# Patient Record
Sex: Male | Born: 1960 | Race: White | Hispanic: No | Marital: Married | Smoking: Former smoker
Health system: Southern US, Community
[De-identification: ages and names within clinical notes are randomized; demographics above are authoritative.]

## PROBLEM LIST (undated history)

## (undated) DIAGNOSIS — E7800 Pure hypercholesterolemia, unspecified: Secondary | ICD-10-CM

## (undated) DIAGNOSIS — I10 Essential (primary) hypertension: Secondary | ICD-10-CM

## (undated) HISTORY — DX: Pure hypercholesterolemia, unspecified: E78.00

## (undated) HISTORY — DX: Essential (primary) hypertension: I10

## (undated) HISTORY — PX: WISDOM TOOTH EXTRACTION: SHX21

## (undated) HISTORY — PX: INGUINAL HERNIA REPAIR: SUR1180

---

## 2009-03-22 ENCOUNTER — Ambulatory Visit: Payer: Self-pay | Admitting: Family Medicine

## 2009-03-22 DIAGNOSIS — I10 Essential (primary) hypertension: Secondary | ICD-10-CM | POA: Insufficient documentation

## 2009-03-22 DIAGNOSIS — J209 Acute bronchitis, unspecified: Secondary | ICD-10-CM

## 2009-03-22 DIAGNOSIS — E785 Hyperlipidemia, unspecified: Secondary | ICD-10-CM | POA: Insufficient documentation

## 2010-04-27 ENCOUNTER — Ambulatory Visit: Payer: Self-pay | Admitting: Emergency Medicine

## 2010-12-10 NOTE — Assessment & Plan Note (Signed)
Summary: COLD?/TM   Vital Signs:  Patient Profile:   49 Years Old Male CC:      productive cough X 1 week, reoccuring  Height:     69 inches Weight:      271 pounds O2 Sat:      97 % O2 treatment:    Room Air Temp:     98.6 degrees F oral Pulse rate:   126 / minute Resp:     14 per minute BP sitting:   136 / 91  (right arm) Cuff size:   large  Pt. in pain?   no  Vitals Entered By: Lajean Saver RN (April 27, 2010 3:38 PM)                   Updated Prior Medication List: LISINOPRIL 20 MG TABS (LISINOPRIL) 1 qd WELLBUTRIN SR 200 MG XR12H-TAB (BUPROPION HCL) 1 bid VYTORIN 10-20 MG TABS (EZETIMIBE-SIMVASTATIN) 1 qd SIMVASTATIN 40 MG TABS (SIMVASTATIN) once daily  Current Allergies (reviewed today): No known allergies History of Present Illness Chief Complaint: productive cough X 1 week, reoccuring  History of Present Illness: Cough for 1 week.  Wife was here earlier in the week with same symptoms.  He has productive cough, green phlegm, nasal congestion.  No fever, chills, N/V. Cheratussin helps.  Cefdinir by doctor about 1 month ago didn't take all his symptoms away.  REVIEW OF SYSTEMS Constitutional Symptoms      Denies fever, chills, night sweats, weight loss, weight gain, and fatigue.  Eyes       Denies change in vision, eye pain, eye discharge, glasses, contact lenses, and eye surgery. Ear/Nose/Throat/Mouth       Complains of sinus problems and hoarseness.      Denies hearing loss/aids, change in hearing, ear pain, ear discharge, dizziness, frequent runny nose, frequent nose bleeds, sore throat, and tooth pain or bleeding.  Respiratory       Complains of productive cough.      Denies dry cough, wheezing, shortness of breath, asthma, bronchitis, and emphysema/COPD.  Cardiovascular       Denies murmurs, chest pain, and tires easily with exhertion.    Gastrointestinal       Denies stomach pain, nausea/vomiting, diarrhea, constipation, blood in bowel movements, and  indigestion. Genitourniary       Denies painful urination, kidney stones, and loss of urinary control. Neurological       Denies paralysis, seizures, and fainting/blackouts. Musculoskeletal       Denies muscle pain, joint pain, joint stiffness, decreased range of motion, redness, swelling, muscle weakness, and gout.  Skin       Denies bruising, unusual mles/lumps or sores, and hair/skin or nail changes.  Psych       Denies mood changes, temper/anger issues, anxiety/stress, speech problems, depression, and sleep problems. Other Comments: patient was treated for same symptoms 3 weeks ago with antibiotic   Past History:  Past Medical History: Reviewed history from 03/22/2009 and no changes required. Hyperlipidemia Hypertension  Past Surgical History: Reviewed history from 03/22/2009 and no changes required. Inguinal herniorrhaphy  Family History: Reviewed history from 03/22/2009 and no changes required. Mother, Healthy Father, D, Bladder CA Brother, Healthy Sister, Healthy  Social History: Reviewed history from 03/22/2009 and no changes required. X-smoker, quit 25 yrs ago ETOH-yes No drugs [employer] Physical Exam General appearance: well developed, well nourished, no acute distress.  Actively coughing during exam Ears: normal, no lesions or deformities Nasal: clear discharge Oral/Pharynx:  No exudate, uvula midline without deviation.  +Postnasal drip. Neck: neck supple,  trachea midline, no masses Chest/Lungs: no rales, wheezes, or rhonchi bilateral, breath sounds equal without effort Heart: regular rate and  rhythm, no murmur Skin: no obvious rashes or lesions  Plan New Medications/Changes: CHERATUSSIN AC 100-10 MG/5ML SYRP (GUAIFENESIN-CODEINE) 5-10cc by mouth Q6 hours as needed for cough  #180cc x 0, 04/27/2010, Hoyt Koch MD ZITHROMAX Z-PAK 250 MG TABS (AZITHROMYCIN) Use as directed  #1 x 0, 04/27/2010, Hoyt Koch MD  New Orders: New Patient  Level III [phone]  The patient and/or caregiver has been counseled thoroughly with regard to medications prescribed including dosage, schedule, interactions, rationale for use, and possible side effects and they verbalize understanding.  Diagnoses and expected course of recovery discussed and will return if not improved as expected or if the condition worsens. Patient and/or caregiver verbalized understanding.  Prescriptions: CHERATUSSIN AC 100-10 MG/5ML SYRP (GUAIFENESIN-CODEINE) 5-10cc by mouth Q6 hours as needed for cough  #180cc x 0   Entered and Authorized by:   Hoyt Koch MD   Signed by:   Hoyt Koch MD on 04/27/2010   Method used:   Printed then faxed to ...       [facility]  [address] [phone]* (retail)       [address] [place], [place]  [address]       Ph: [phone] or [phone]       Fax: [phone]   RxID:   [id number] ZITHROMAX Z-PAK 250 MG TABS (AZITHROMYCIN) Use as directed  #1 x 0   Entered and Authorized by:   Hoyt Koch MD   Signed by:   Hoyt Koch MD on 04/27/2010   Method used:   Printed then faxed to ...       [facility]  [address] [phone]* (retail)       [address] 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

## 2014-05-17 ENCOUNTER — Encounter: Payer: Self-pay | Admitting: Internal Medicine

## 2014-06-29 ENCOUNTER — Ambulatory Visit (AMBULATORY_SURGERY_CENTER): Payer: BC Managed Care – PPO

## 2014-06-29 VITALS — Ht 73.0 in | Wt 297.0 lb

## 2014-06-29 DIAGNOSIS — Z1211 Encounter for screening for malignant neoplasm of colon: Secondary | ICD-10-CM

## 2014-06-29 MED ORDER — MOVIPREP 100 G PO SOLR: 1.0000 | Freq: Once | ORAL | Status: DC

## 2014-06-29 NOTE — Progress Notes (Signed)
No allergies to eggs or soy No diet/weight loss meds No home oxygen No past problems with anesthesia  Has email  Emmi instructions given for colonoscopy 

## 2014-07-13 ENCOUNTER — Encounter: Payer: Self-pay | Admitting: Internal Medicine

## 2014-07-13 ENCOUNTER — Ambulatory Visit (AMBULATORY_SURGERY_CENTER): Payer: BC Managed Care – PPO | Admitting: Internal Medicine

## 2014-07-13 VITALS — BP 109/60 | HR 69 | Temp 98.6°F | Resp 15 | Ht 73.0 in | Wt 297.0 lb

## 2014-07-13 DIAGNOSIS — D126 Benign neoplasm of colon, unspecified: Secondary | ICD-10-CM

## 2014-07-13 DIAGNOSIS — Z1211 Encounter for screening for malignant neoplasm of colon: Secondary | ICD-10-CM

## 2014-07-13 MED ORDER — SODIUM CHLORIDE 0.9 % IV SOLN: 500.0000 mL | INTRAVENOUS | Status: DC

## 2014-07-13 NOTE — Progress Notes (Signed)
A/ox3 pleased with MAC, report to Jane RN 

## 2014-07-13 NOTE — Progress Notes (Signed)
Called to room to assist during endoscopic procedure.  Patient ID and intended procedure confirmed with present staff. Received instructions for my participation in the procedure from the performing physician.  

## 2014-07-13 NOTE — Op Note (Signed)
[facility]  [address] [place], [address]   COLONOSCOPY PROCEDURE REPORT  PATIENT: Ruben Reeves, Ruben Reeves  MR#: [id number] BIRTHDATE: [birth year] , 52  yrs. old GENDER: Male ENDOSCOPIST: Jerene Bears, MD REFERRED EU:[id number] Reynaldo Minium, M.D. PROCEDURE DATE:  07/13/2014 PROCEDURE:   Colonoscopy with snare polypectomy First Screening Colonoscopy - Avg.  risk and is 50 yrs.  old or older Yes.  Prior Negative Screening - Now for repeat screening. N/A  History of Adenoma - Now for follow-up colonoscopy & has been > or = to 3 yrs.  N/A  Polyps Removed Today? Yes. ASA CLASS:   Class II INDICATIONS:average risk screening and first colonoscopy. MEDICATIONS: MAC sedation, administered by CRNA and propofol (Diprivan) 400mg  IV  DESCRIPTION OF PROCEDURE:   After the risks benefits and alternatives of the procedure were thoroughly explained, informed consent was obtained.  A digital rectal exam revealed no rectal mass.   The LB [id number]  endoscope was introduced through the anus and advanced to the cecum, which was identified by both the appendix and ileocecal valve. No adverse events experienced. The quality of the prep was good, using MoviPrep  The instrument was then slowly withdrawn as the colon was fully examined.  COLON FINDINGS: Two sessile polyps measuring 6 and 3 mm in size were found in the ascending colon (SSA) and transverse colon. Polypectomy was performed using cold snare.  All resections were complete and all polyp tissue was completely retrieved.   There was moderate diverticulosis noted in the ascending colon, transverse colon, descending colon, and sigmoid colon with associated muscular hypertrophy.  Retroflexed views revealed no abnormalities. The time to cecum=2 minutes 18 seconds.  Withdrawal time=15 minutes 55 seconds.  The scope was withdrawn and the procedure completed. COMPLICATIONS: There were no complications.  ENDOSCOPIC IMPRESSION: 1.   Two  sessile polyps measuring 6 and 3 mm in size were found in the ascending colon and transverse colon; Polypectomy was performed using cold snare 2.   There was moderate diverticulosis noted in the ascending colon, transverse colon, descending colon, and sigmoid colon  RECOMMENDATIONS: 1.  Avoid all NSAIDS for the next 2 weeks. 2.  High fiber diet 3.  If the polyps removed today are proven to be adenomatous (pre-cancerous) polyps, you will need a repeat colonoscopy in 5 years.  Otherwise you should continue to follow colorectal cancer screening guidelines for "routine risk" patients with colonoscopy in 10 years.  You will receive a letter within 1-2 weeks with the results of your biopsy as well as final recommendations.  Please call my office if you have not received a letter after 3 weeks.   eSigned:  Jerene Bears, MD 07/13/2014 10:07 AM      cc: The Patient and Burnard Bunting, MD

## 2014-07-13 NOTE — Patient Instructions (Signed)
YOU HAD AN ENDOSCOPIC PROCEDURE TODAY AT THE [facility]: Refer to the procedure report that was given to you for any specific questions about what was found during the examination.  If the procedure report does not answer your questions, please call your gastroenterologist to clarify.  If you requested that your care partner not be given the details of your procedure findings, then the procedure report has been included in a sealed envelope for you to review at your convenience later.  YOU SHOULD EXPECT: Some feelings of bloating in the abdomen. Passage of more gas than usual.  Walking can help get rid of the air that was put into your GI tract during the procedure and reduce the bloating. If you had a lower endoscopy (such as a colonoscopy or flexible sigmoidoscopy) you may notice spotting of blood in your stool or on the toilet paper. If you underwent a bowel prep for your procedure, then you may not have a normal bowel movement for a few days.  DIET: Your first meal following the procedure should be a light meal and then it is ok to progress to your normal diet.  A half-sandwich or bowl of soup is an example of a good first meal.  Heavy or fried foods are harder to digest and may make you feel nauseous or bloated.  Likewise meals heavy in dairy and vegetables can cause extra gas to form and this can also increase the bloating.  Drink plenty of fluids but you should avoid alcoholic beverages for 24 hours.  ACTIVITY: Your care partner should take you home directly after the procedure.  You should plan to take it easy, moving slowly for the rest of the day.  You can resume normal activity the day after the procedure however you should NOT DRIVE or use heavy machinery for 24 hours (because of the sedation medicines used during the test).    SYMPTOMS TO REPORT IMMEDIATELY: A gastroenterologist can be reached at any hour.  During normal business hours, 8:30 AM to 5:00 PM Monday through Friday,  call [phone].  After hours and on weekends, please call the GI answering service at [phone] who will take a message and have the physician on call contact you.   Following lower endoscopy (colonoscopy or flexible sigmoidoscopy):  Excessive amounts of blood in the stool  Significant tenderness or worsening of abdominal pains  Swelling of the abdomen that is new, acute  Fever of 100F or higher    FOLLOW UP: If any biopsies were taken you will be contacted by phone or by letter within the next 1-3 weeks.  Call your gastroenterologist if you have not heard about the biopsies in 3 weeks.  Our staff will call the home number listed on your records the next business day following your procedure to check on you and address any questions or concerns that you may have at that time regarding the information given to you following your procedure. This is a courtesy call and so if there is no answer at the home number and we have not heard from you through the emergency physician on call, we will assume that you have returned to your regular daily activities without incident.  SIGNATURES/CONFIDENTIALITY: You and/or your care partner have signed paperwork which will be entered into your electronic medical record.  These signatures attest to the fact that that the information above on your After Visit Summary has been reviewed and is understood.  Full responsibility of the confidentiality   this discharge information lies with you and/or your care-partner.  Polyp, diverticulosis and high fiber diet information given. 

## 2014-07-14 ENCOUNTER — Telehealth: Payer: Self-pay | Admitting: *Deleted

## 2014-07-14 NOTE — Telephone Encounter (Signed)
  Follow up Call-  Call back number 07/13/2014  Post procedure Call Back phone  # [phone]  Permission to leave phone message Yes     Patient questions:  Do you have a fever, pain , or abdominal swelling? No. Pain Score  0 *  Have you tolerated food without any problems? Yes.    Have you been able to return to your normal activities? Yes.    Do you have any questions about your discharge instructions: Diet   No. Medications  No. Follow up visit  No.  Do you have questions or concerns about your Care? Yes.    Actions: * If pain score is 4 or above: No action needed, pain <4.

## 2014-07-21 ENCOUNTER — Encounter: Payer: Self-pay | Admitting: Internal Medicine

## 2014-11-10 ENCOUNTER — Emergency Department (HOSPITAL_BASED_OUTPATIENT_CLINIC_OR_DEPARTMENT_OTHER)
Admission: EM | Admit: 2014-11-10 | Discharge: 2014-11-10 | Disposition: A | Discharge status: Home / Self Care | Payer: 59 | Attending: Emergency Medicine | Admitting: Emergency Medicine

## 2014-11-10 ENCOUNTER — Encounter (HOSPITAL_BASED_OUTPATIENT_CLINIC_OR_DEPARTMENT_OTHER): Payer: Self-pay

## 2014-11-10 ENCOUNTER — Emergency Department (HOSPITAL_BASED_OUTPATIENT_CLINIC_OR_DEPARTMENT_OTHER): Payer: 59

## 2014-11-10 DIAGNOSIS — E780 Pure hypercholesterolemia: Secondary | ICD-10-CM | POA: Insufficient documentation

## 2014-11-10 DIAGNOSIS — Z7982 Long term (current) use of aspirin: Secondary | ICD-10-CM | POA: Diagnosis not present

## 2014-11-10 DIAGNOSIS — I10 Essential (primary) hypertension: Secondary | ICD-10-CM | POA: Diagnosis not present

## 2014-11-10 DIAGNOSIS — R600 Localized edema: Secondary | ICD-10-CM | POA: Insufficient documentation

## 2014-11-10 DIAGNOSIS — Z87891 Personal history of nicotine dependence: Secondary | ICD-10-CM | POA: Diagnosis not present

## 2014-11-10 DIAGNOSIS — N441 Cyst of tunica albuginea testis: Secondary | ICD-10-CM | POA: Diagnosis not present

## 2014-11-10 DIAGNOSIS — Z79899 Other long term (current) drug therapy: Secondary | ICD-10-CM | POA: Diagnosis not present

## 2014-11-10 DIAGNOSIS — N5089 Other specified disorders of the male genital organs: Secondary | ICD-10-CM

## 2014-11-10 DIAGNOSIS — N508 Other specified disorders of male genital organs: Secondary | ICD-10-CM | POA: Diagnosis present

## 2014-11-10 LAB — CBC WITH DIFFERENTIAL/PLATELET
BASOS PCT: 1 % (ref 0–1)
Basophils Absolute: 0.1 10*3/uL (ref 0.0–0.1)
EOS ABS: 0.3 10*3/uL (ref 0.0–0.7)
Eosinophils Relative: 3 % (ref 0–5)
HCT: 39.1 % (ref 39.0–52.0)
HEMOGLOBIN: 13.5 g/dL (ref 13.0–17.0)
LYMPHS ABS: 1.8 10*3/uL (ref 0.7–4.0)
Lymphocytes Relative: 20 % (ref 12–46)
MCH: 32.2 pg (ref 26.0–34.0)
MCHC: 34.5 g/dL (ref 30.0–36.0)
MCV: 93.3 fL (ref 78.0–100.0)
MONO ABS: 0.9 10*3/uL (ref 0.1–1.0)
Monocytes Relative: 10 % (ref 3–12)
NEUTROS ABS: 6 10*3/uL (ref 1.7–7.7)
Neutrophils Relative %: 66 % (ref 43–77)
PLATELETS: 290 10*3/uL (ref 150–400)
RBC: 4.19 MIL/uL — AB (ref 4.22–5.81)
RDW: 12 % (ref 11.5–15.5)
WBC: 8.9 10*3/uL (ref 4.0–10.5)

## 2014-11-10 LAB — BASIC METABOLIC PANEL
Anion gap: 8 (ref 5–15)
BUN: 13 mg/dL (ref 6–23)
CALCIUM: 9 mg/dL (ref 8.4–10.5)
CO2: 28 mmol/L (ref 19–32)
Chloride: 100 mEq/L (ref 96–112)
Creatinine, Ser: 0.74 mg/dL (ref 0.50–1.35)
GFR calc non Af Amer: >90 mL/min (ref >90)
Glucose, Bld: 131 mg/dL — ABNORMAL HIGH (ref 70–99)
POTASSIUM: 3.6 mmol/L (ref 3.5–5.1)
Sodium: 136 mmol/L (ref 135–145)

## 2014-11-10 LAB — URINALYSIS, ROUTINE W REFLEX MICROSCOPIC
BILIRUBIN URINE: NEGATIVE
Glucose, UA: NEGATIVE mg/dL
Hgb urine dipstick: NEGATIVE
KETONES UR: NEGATIVE mg/dL
Leukocytes, UA: NEGATIVE
Nitrite: NEGATIVE
Protein, ur: NEGATIVE mg/dL
Specific Gravity, Urine: 1.019 (ref 1.005–1.030)
Urobilinogen, UA: 0.2 mg/dL (ref 0.0–1.0)
pH: 6.5 (ref 5.0–8.0)

## 2014-11-10 NOTE — ED Notes (Signed)
Pt C/o left testicle pain and swelling since weds.  Pt denies any known injury to the area.  Pt h/o enlarged prostates and bladder infections.  No recent changes to medications.  Pt states pain comes and goes and describes as "throbbing".  Seen at urgent care this am and advised to come to ed for u/s testing.  Pt denies any fever, nausea or other symptoms.

## 2014-11-10 NOTE — Discharge Instructions (Signed)
As discussed you need to follow up with your urologist. Scrotal Masses Scrotal swelling is common in men of all ages. Common types of testicular masses include:   Hydrocele. The most common benign testicular mass in an adult. Hydroceles are generally soft and painless collections of fluid in the scrotal sac. These can rapidly change size as the fluid enters or leaves. Hydroceles can be associated with an underlying cancer of the testicle.  Spermatoceles. Generally soft and painless cyst-like masses in the scrotum that contain fluid, usually above the testicle. They can rapidly change size as the fluid enters or leaves. They are more prominent while standing or exercising. Sometimes, spermatoceles may cause a sensation of heaviness or a dull ache.  Orchitis. Inflammation of the testicle. It is painful and may be associated with a fever or symptoms of a urinary tract infection, including frequent and painful urination. It is common in males who have the mumps.  Varicocele. An enlargement of the veins that drain the testicles. Varicoceles usually occur on the left side of the scrotum. This condition can increase the risk of infertility. Varicocele is sometimes more prominent while standing or exercising. Sometimes, varicoceles may cause a sensation of heaviness or a dull ache.  Inguinal hernia. A bulge caused by a portion of intestine protruding into the scrotum through a weak area in the abdominal muscles. Hernias may or may not be painful. They are soft and usually enlarge with coughing or straining.  Torsion of the testis. This can cause a testicular mass that develops quickly and is associated with tenderness or fever, or both. It is caused by a twisting of the testicle within the sac. It also reduces the blood supply and can destroy the testis if not treated quickly with surgery.  Epididymitis. Inflammation of the epididymis (a structure attached above and behind the testicle), usually caused by a  urinary tract infection or a sexually transmitted infection. This generally shows up as testicular discomfort and swelling and may include pain during urination. It is frequently associated with a testicle infection.  Testicular appendages. Remnants of tissue on the testis present since birth. A testicular appendage can twist on its blood supply and cause pain. In most cases, this is seen as a blue dot on the scrotum.  Hematocele. A collection of blood between the layers of the sac inside the scrotum. It usually is caused by trauma to the scrotum.  Sebaceous cysts. These can be a swelling in the skin of the scrotum and are usually painless.  Cancer (carcinoma) of the skin of the scrotum. It can cause scrotal swelling, but this is rare. Document Released: 05/03/2003 Document Revised: 06/29/2013 Document Reviewed: 04/18/2013 [facility] Patient Information 2015 [place], [place]. This information is not intended to replace advice given to you by your health care provider. Make sure you discuss any questions you have with your health care provider.

## 2014-11-10 NOTE — ED Provider Notes (Signed)
CSN: [id number]     Arrival date & time 11/10/14  1257 History   First MD Initiated Contact with Patient 11/10/14 1333     Chief Complaint  Patient presents with  . Testicle Pain     (Consider location/radiation/quality/duration/timing/severity/associated sxs/prior Treatment) HPI Comments: Pt c/o testicular pain that started 3 days ago. Denies abdominal pain, redness, warmth, dysuria or discharge. Pt denies history of similar symptoms. Pt states that no painful unless he moves in the wrong direction. Has had uti and prostatitis previous but nothing recently. No fever or n/v.  The history is provided by the patient. No language interpreter was used.    Past Medical History  Diagnosis Date  . Hypertension   . Hypercholesteremia    Past Surgical History  Procedure Laterality Date  . Inguinal hernia repair    . Wisdom tooth extraction     Family History  Problem Relation Age of Onset  . Colon cancer Neg Hx   . Pancreatic cancer Neg Hx   . Rectal cancer Neg Hx   . Stomach cancer Neg Hx   . Colon polyps Mother   . Bladder Cancer Father   . Kidney disease Father   . Colon polyps Father   . Heart disease Maternal Grandmother   . Stroke Maternal Grandfather   . Heart disease Paternal Grandmother   . Stroke Paternal Grandfather    History  Substance Use Topics  . Smoking status: Former Smoker    Quit date: 06/29/1985  . Smokeless tobacco: Never Used  . Alcohol Use: 0.6 oz/week    1 Glasses of wine per week    Review of Systems  All other systems reviewed and are negative.     Allergies  Sulfa antibiotics  Home Medications   Prior to Admission medications   Medication Sig Start Date End Date Taking? Authorizing Provider  aspirin 81 MG tablet Take 81 mg by mouth daily.    Historical Provider, MD  cholecalciferol (VITAMIN D) 1000 UNITS tablet Take 1,000 Units by mouth 2 (two) times daily.    Historical Provider, MD  Coenzyme Q10 (CO Q 10 PO) Take 200 mg by mouth  daily.    Historical Provider, MD  hydrochlorothiazide (HYDRODIURIL) 25 MG tablet Take 25 mg by mouth daily.    Historical Provider, MD  lisinopril (PRINIVIL,ZESTRIL) 20 MG tablet Take 20 mg by mouth daily.    Historical Provider, MD  Magnesium Oxide (MAG-CAPS PO) Take by mouth. mag citrate 400mg  po daily    Historical Provider, MD  Multiple Vitamin (MULTIVITAMIN) tablet Take 1 tablet by mouth daily.    Historical Provider, MD  Omega-3 Fatty Acids (FISH OIL) 1200 MG CPDR Take 1,200 mg by mouth 2 (two) times daily.    Historical Provider, MD  simvastatin (ZOCOR) 20 MG tablet Take 20 mg by mouth daily.    Historical Provider, MD  TURMERIC PO Take 1,160 mg by mouth daily.    Historical Provider, MD  vitamin C (ASCORBIC ACID) 500 MG tablet Take 500 mg by mouth daily.    Historical Provider, MD   BP 147/86 mmHg  Pulse 71  Temp(Src) 98.2 F (36.8 C) (Oral)  Resp 16  Ht 6' (1.829 m)  Wt 302 lb (136.986 kg)  BMI 40.95 kg/m2  SpO2 100% Physical Exam  Constitutional: He appears well-developed and well-nourished.  Cardiovascular: Normal rate and regular rhythm.   Abdominal: Soft. Bowel sounds are normal. There is no tenderness.  Genitourinary:  Left testicular swelling noted. No definite  hernia noted. No drainage noted  Musculoskeletal: He exhibits edema.  Neurological: He is alert.  Skin: Skin is warm and dry.  Nursing note and vitals reviewed.   ED Course  Procedures (including critical care time) Labs Review Labs Reviewed  CBC WITH DIFFERENTIAL - Abnormal; Notable for the following:    RBC 4.19 (*)    All other components within normal limits  BASIC METABOLIC PANEL - Abnormal; Notable for the following:    Glucose, Bld 131 (*)    All other components within normal limits  URINALYSIS, ROUTINE W REFLEX MICROSCOPIC    Imaging Review US Scrotum  11/10/2014   CLINICAL DATA:  Initial evaluation for left testicular swelling and heaviness and tenderness to palpation constantly for 2  days, personal history of right inguinal hernia repair 45 years ago and vasectomy 25 years ago  EXAM: [place]: Complete ultrasound examination of the testicles, epididymis, and other scrotal structures was performed. Color and spectral Doppler ultrasound were also utilized to evaluate blood flow to the testicles.  COMPARISON:  None.  FINDINGS: Right testicle  Measurements: 38 x 24 x 27 mm. No mass or microlithiasis visualized.  Left testicle  Measurements: 38 x 20 x 30 mm. No mass or microlithiasis visualized.  Right epididymis:  6 mm right epididymal cyst  Left epididymis: 4 mm epididymal cyst. There is also a prominent volume of echogenic tissue around the left epididymis, which may represent adipose tissue; it is asymmetric compared to the right side but not clearly of acute significance.It does not change with Valsalva, as would be expected of a fat-containing inguinal hernia.  Hydrocele:  Small bilateral hydroceles  Varicocele:  None visualized.  Pulsed Doppler interrogation of both testes demonstrates low resistance arterial and venous waveforms bilaterally.  Other findings: There is a complex cystic structure posterior and lateral to the left testicle, separate from the testicle and also not clearly related to the left epididymis. There is complex fluid within the structure with some mild debris layering dependently. It measures 19 x 19 x 14mm.  IMPRESSION: 1.  No evidence of torsion, orchitis, or epididymitis.  2. 11mm cystic structure in the left hemiscrotum, possibly a tunica albuginea cyst or other similar finding not of acute origin.  3. Asymmetric adipose tissue in left scrotum adjacent to epididymis, which does not respond to Valsalva as would be expected of an inguinal hernia. That remains a possibility that would be better evaluated with CT scan.   Electronically Signed   By: Skipper Cliche M.D.   On: 11/10/2014 14:43   [place] Art/ven Flow Abd  Pelv Doppler  11/10/2014   CLINICAL DATA:  Initial evaluation for left testicular swelling and heaviness and tenderness to palpation constantly for 2 days, personal history of right inguinal hernia repair 45 years ago and vasectomy 25 years ago  EXAM: [place]: Complete ultrasound examination of the testicles, epididymis, and other scrotal structures was performed. Color and spectral Doppler ultrasound were also utilized to evaluate blood flow to the testicles.  COMPARISON:  None.  FINDINGS: Right testicle  Measurements: 38 x 24 x 27 mm. No mass or microlithiasis visualized.  Left testicle  Measurements: 38 x 20 x 30 mm. No mass or microlithiasis visualized.  Right epididymis:  6 mm right epididymal cyst  Left epididymis: 4 mm epididymal cyst. There is also a prominent volume of echogenic tissue around the left epididymis, which may  represent adipose tissue; it is asymmetric compared to the right side but not clearly of acute significance.It does not change with Valsalva, as would be expected of a fat-containing inguinal hernia.  Hydrocele:  Small bilateral hydroceles  Varicocele:  None visualized.  Pulsed Doppler interrogation of both testes demonstrates low resistance arterial and venous waveforms bilaterally.  Other findings: There is a complex cystic structure posterior and lateral to the left testicle, separate from the testicle and also not clearly related to the left epididymis. There is complex fluid within the structure with some mild debris layering dependently. It measures 19 x 19 x 42mm.  IMPRESSION: 1.  No evidence of torsion, orchitis, or epididymitis.  2. 48mm cystic structure in the left hemiscrotum, possibly a tunica albuginea cyst or other similar finding not of acute origin.  3. Asymmetric adipose tissue in left scrotum adjacent to epididymis, which does not respond to Valsalva as would be expected of an inguinal hernia. That remains a  possibility that would be better evaluated with CT scan.   Electronically Signed   By: Skipper Cliche M.D.   On: 11/10/2014 14:43     EKG Interpretation None      MDM   Final diagnoses:  Testicular swelling  Cyst of tunica albuginea testis    Pt not having any pain. Will have follow up with his urologist DR. Evan. Nothing to be acutely. Pt doesn't have torsion. Exam not consistent with hernia    Glendell Docker, NP 11/10/14 [place], MD 11/10/14 [phone]

## 2014-11-10 NOTE — ED Notes (Signed)
Left testicle pain since Wednesday. No dysuria.

## 2015-01-03 ENCOUNTER — Other Ambulatory Visit: Payer: Self-pay | Admitting: Urology

## 2015-01-03 DIAGNOSIS — R1909 Other intra-abdominal and pelvic swelling, mass and lump: Secondary | ICD-10-CM

## 2015-01-10 ENCOUNTER — Ambulatory Visit
Admission: RE | Admit: 2015-01-10 | Discharge: 2015-01-10 | Disposition: A | Discharge status: Home / Self Care | Payer: 59 | Source: Ambulatory Visit | Attending: Urology | Admitting: Urology

## 2015-01-10 DIAGNOSIS — R1909 Other intra-abdominal and pelvic swelling, mass and lump: Secondary | ICD-10-CM

## 2015-01-10 MED ORDER — IOHEXOL 300 MG/ML  SOLN
125.0000 mL | Freq: Once | INTRAMUSCULAR | Status: AC | PRN
  Administered 2015-01-10: 125 mL via INTRAVENOUS

## 2015-02-25 ENCOUNTER — Emergency Department (INDEPENDENT_AMBULATORY_CARE_PROVIDER_SITE_OTHER)
Admission: EM | Admit: 2015-02-25 | Discharge: 2015-02-25 | Disposition: A | Discharge status: Home / Self Care | Payer: 59 | Source: Home / Self Care | Attending: Family Medicine | Admitting: Family Medicine

## 2015-02-25 ENCOUNTER — Encounter: Payer: Self-pay | Admitting: *Deleted

## 2015-02-25 DIAGNOSIS — S61209A Unspecified open wound of unspecified finger without damage to nail, initial encounter: Secondary | ICD-10-CM | POA: Diagnosis not present

## 2015-02-25 DIAGNOSIS — Z23 Encounter for immunization: Secondary | ICD-10-CM | POA: Diagnosis not present

## 2015-02-25 MED ORDER — TETANUS-DIPHTHERIA TOXOIDS TD 5-2 LFU IM INJ
0.5000 mL | INJECTION | Freq: Once | INTRAMUSCULAR | Status: AC
  Administered 2015-02-25: 0.5 mL via INTRAMUSCULAR

## 2015-02-25 NOTE — ED Notes (Signed)
Pt was using a mandolin and cut his middle finger on R hand approx 2 hours ago.  Bleed stops when pressure applied but starts when bandage removed.  Pt is experiencing no pain.

## 2015-02-25 NOTE — ED Provider Notes (Signed)
CSN: [id number]     Arrival date & time 02/25/15  1433 History   First MD Initiated Contact with Patient 02/25/15 1549     Chief Complaint  Patient presents with  . Extremity Laceration    R hand middle finger      HPI Comments: Approximately two hours ago patient was using a mandolin slicer when he cut the tip of his right third finger.  He does not remember his last tetanus immunization  Patient is a 54 y.o. male presenting with skin laceration. The history is provided by the patient.  Laceration Location:  Finger Finger laceration location:  R middle finger Length (cm):  0.8 Depth:  Through dermis Quality: avulsion   Bleeding: controlled with pressure   Time since incident:  3 hours Laceration mechanism:  Knife Pain details:    Quality:  Aching   Severity:  No pain Foreign body present:  No foreign bodies Relieved by:  Pressure Worsened by:  Movement Tetanus status:  Out of date   Past Medical History  Diagnosis Date  . Hypertension   . Hypercholesteremia    Past Surgical History  Procedure Laterality Date  . Inguinal hernia repair    . Wisdom tooth extraction     Family History  Problem Relation Age of Onset  . Colon cancer Neg Hx   . Pancreatic cancer Neg Hx   . Rectal cancer Neg Hx   . Stomach cancer Neg Hx   . Colon polyps Mother   . Bladder Cancer Father   . Kidney disease Father   . Colon polyps Father   . Heart disease Maternal Grandmother   . Stroke Maternal Grandfather   . Heart disease Paternal Grandmother   . Stroke Paternal Grandfather    History  Substance Use Topics  . Smoking status: Former Smoker    Quit date: 06/29/1985  . Smokeless tobacco: Never Used  . Alcohol Use: 0.6 oz/week    1 Glasses of wine per week    Review of Systems  All other systems reviewed and are negative.   Allergies  Sulfa antibiotics  Home Medications   Prior to Admission medications   Medication Sig Start Date End Date Taking? Authorizing Provider    aspirin 81 MG tablet Take 81 mg by mouth daily.    Historical Provider, MD  cholecalciferol (VITAMIN D) 1000 UNITS tablet Take 1,000 Units by mouth 2 (two) times daily.    Historical Provider, MD  Coenzyme Q10 (CO Q 10 PO) Take 200 mg by mouth daily.    Historical Provider, MD  hydrochlorothiazide (HYDRODIURIL) 25 MG tablet Take 25 mg by mouth daily.    Historical Provider, MD  lisinopril (PRINIVIL,ZESTRIL) 20 MG tablet Take 20 mg by mouth daily.    Historical Provider, MD  Magnesium Oxide (MAG-CAPS PO) Take by mouth. mag citrate 400mg  po daily    Historical Provider, MD  Multiple Vitamin (MULTIVITAMIN) tablet Take 1 tablet by mouth daily.    Historical Provider, MD  Omega-3 Fatty Acids (FISH OIL) 1200 MG CPDR Take 1,200 mg by mouth 2 (two) times daily.    Historical Provider, MD  simvastatin (ZOCOR) 20 MG tablet Take 20 mg by mouth daily.    Historical Provider, MD  TURMERIC PO Take 1,160 mg by mouth daily.    Historical Provider, MD  vitamin C (ASCORBIC ACID) 500 MG tablet Take 500 mg by mouth daily.    Historical Provider, MD   BP 144/73 mmHg  Pulse 86  Temp(Src)  98.2 F (36.8 C) (Oral)  Ht 6\' 1"  (1.854 m)  Wt 298 lb (135.172 kg)  BMI 39.32 kg/m2  SpO2 100% Physical Exam  Constitutional: He is oriented to person, place, and time. He appears well-developed and well-nourished.  HENT:  Head: Atraumatic.  Eyes: Pupils are equal, round, and reactive to light.  Musculoskeletal:       Right hand: He exhibits laceration. He exhibits normal range of motion, no tenderness, normal capillary refill and no swelling.       Hands: At the very tip of patient's right 3rd finger there is a superficial 23mm diameter simple skin avulsion, as noted on diagram.  Wound is clean without debris.  No involvement of fingernail.    Neurological: He is oriented to person, place, and time.  Skin: Skin is warm and dry.  Nursing note and vitals reviewed.   ED Course  Procedures  Wound care fingertip  avulsion:  Cleaned fingertip with HibiClens and saline.  Applied Mepelex Lite dressing, followed by Kerlix gauze wrap and light compression dressing with CoBan wrap.   MDM   1. Avulsion of skin of finger without complication, initial encounter    Leave Mepelex Lite dressing in place until follow-up visit. Keep wound clean and dry.  Return tomorrow for follow-up.    Kandra Nicolas, MD 03/04/15 [phone]

## 2015-02-25 NOTE — Discharge Instructions (Signed)
Keep wound clean and dry.  Leave dressing in place until follow-up visit tomorrow.

## 2015-02-26 ENCOUNTER — Emergency Department (INDEPENDENT_AMBULATORY_CARE_PROVIDER_SITE_OTHER)
Admission: EM | Admit: 2015-02-26 | Discharge: 2015-02-26 | Disposition: A | Discharge status: Home / Self Care | Payer: 59 | Source: Home / Self Care | Attending: Family Medicine | Admitting: Family Medicine

## 2015-02-26 ENCOUNTER — Encounter: Payer: Self-pay | Admitting: Emergency Medicine

## 2015-02-26 DIAGNOSIS — Z4800 Encounter for change or removal of nonsurgical wound dressing: Secondary | ICD-10-CM

## 2015-02-26 NOTE — ED Provider Notes (Signed)
CSN: [id number]     Arrival date & time 02/26/15  1500 History   First MD Initiated Contact with Patient 02/26/15 1520     Chief Complaint  Patient presents with  . Wound Check      HPI Comments: Patient returns for follow-up of skin avulsion of right third fingertip.  He has no complaints, and reports no pain.  The history is provided by the patient.    Past Medical History  Diagnosis Date  . Hypertension   . Hypercholesteremia    Past Surgical History  Procedure Laterality Date  . Inguinal hernia repair    . Wisdom tooth extraction     Family History  Problem Relation Age of Onset  . Colon cancer Neg Hx   . Pancreatic cancer Neg Hx   . Rectal cancer Neg Hx   . Stomach cancer Neg Hx   . Colon polyps Mother   . Bladder Cancer Father   . Kidney disease Father   . Colon polyps Father   . Heart disease Maternal Grandmother   . Stroke Maternal Grandfather   . Heart disease Paternal Grandmother   . Stroke Paternal Grandfather    History  Substance Use Topics  . Smoking status: Former Smoker    Quit date: 06/29/1985  . Smokeless tobacco: Never Used  . Alcohol Use: 0.6 oz/week    1 Glasses of wine per week    Review of Systems No fevers, chills, and sweats.  No finger pain or swelling. Review otherwise negative  Allergies  Sulfa antibiotics  Home Medications   Prior to Admission medications   Medication Sig Start Date End Date Taking? Authorizing Provider  aspirin 81 MG tablet Take 81 mg by mouth daily.    Historical Provider, MD  cholecalciferol (VITAMIN D) 1000 UNITS tablet Take 1,000 Units by mouth 2 (two) times daily.    Historical Provider, MD  Coenzyme Q10 (CO Q 10 PO) Take 200 mg by mouth daily.    Historical Provider, MD  hydrochlorothiazide (HYDRODIURIL) 25 MG tablet Take 25 mg by mouth daily.    Historical Provider, MD  lisinopril (PRINIVIL,ZESTRIL) 20 MG tablet Take 20 mg by mouth daily.    Historical Provider, MD  Magnesium Oxide (MAG-CAPS PO) Take by  mouth. mag citrate 400mg  po daily    Historical Provider, MD  Multiple Vitamin (MULTIVITAMIN) tablet Take 1 tablet by mouth daily.    Historical Provider, MD  Omega-3 Fatty Acids (FISH OIL) 1200 MG CPDR Take 1,200 mg by mouth 2 (two) times daily.    Historical Provider, MD  simvastatin (ZOCOR) 20 MG tablet Take 20 mg by mouth daily.    Historical Provider, MD  TURMERIC PO Take 1,160 mg by mouth daily.    Historical Provider, MD  vitamin C (ASCORBIC ACID) 500 MG tablet Take 500 mg by mouth daily.    Historical Provider, MD   Pulse 93  Temp(Src) 98.3 F (36.8 C) (Oral)  SpO2 96% Physical Exam Nursing notes and Vital Signs reviewed. Appearance:  Patient appears comfortable and alert Right 3rd fingertip:  See section under Wound Care  Skin:  No rash present.   ED Course  Procedures  Wound Care:  Removed dressing and cleaned wound with saline.  No bleeding present.  No evidence infection.  Applied Mepelex Lite dressing, followed by Kerlix gauze and light compression dressing with CoBan wrap.  MDM   1. Dressing change or removal, nonsurgical wound.  Skin avulsion, no evidence cellulitis  Leave today's dressing in place for two days, then remove.  Then apply new Mepelex dressing and leave in place for 3 days (Given additional unopened Mepelex Lite dressing).  Keep bandage clean and dry.  Return for any signs of infection:  Redness, swelling, pain, etc.    Kandra Nicolas, MD 03/04/15 [phone]

## 2015-02-26 NOTE — ED Notes (Signed)
Here for follow up wound check from finger laceration 02-25-15.

## 2015-02-26 NOTE — Discharge Instructions (Signed)
Leave today's dressing in place for two days, then remove.  Then apply new Mepelex dressing and leave in place for 3 days.  Keep bandage clean and dry.  Return for any signs of infection:  Redness, swelling, pain, etc.

## 2016-01-02 IMAGING — US US SCROTUM
1 series · 13 of 25 positions shown · non-contrast
Comparison: None.

CLINICAL DATA: Initial evaluation for left testicular swelling and
heaviness and tenderness to palpation constantly for 2 days,
personal history of right inguinal hernia repair 45 years ago and
vasectomy 25 years ago

EXAM:
SCROTAL ULTRASOUND
DOPPLER ULTRASOUND OF THE TESTICLES
TECHNIQUE: Complete ultrasound examination of the testicles, epididymis, and
other scrotal structures was performed. Color and spectral Doppler
ultrasound were also utilized to evaluate blood flow to the
testicles.

[Series 1: us scrotum · 0.07mm/px · 13 of 95 slices shown]
[im 1/95]
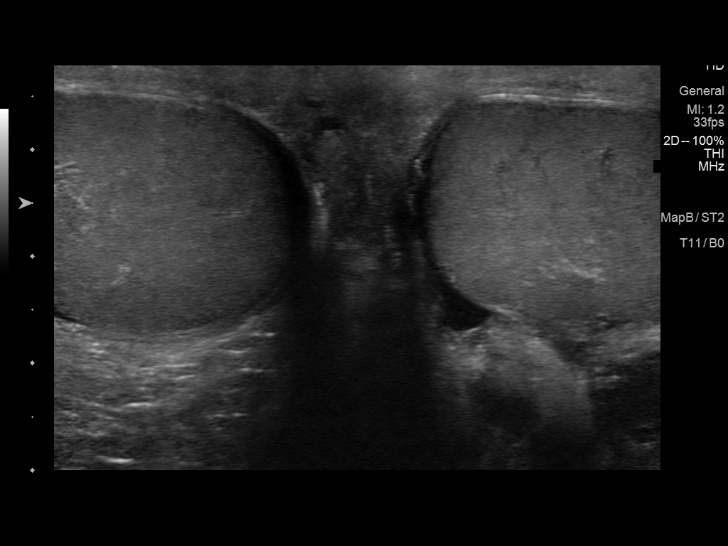
[im 8/95]
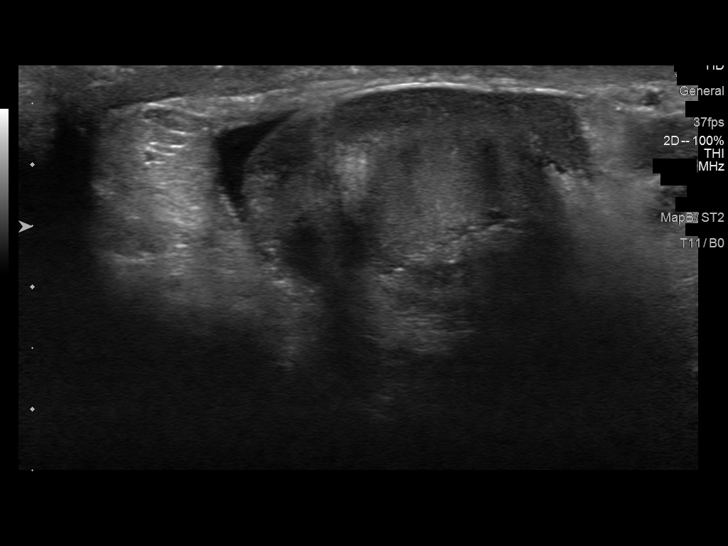
[im 16/95]
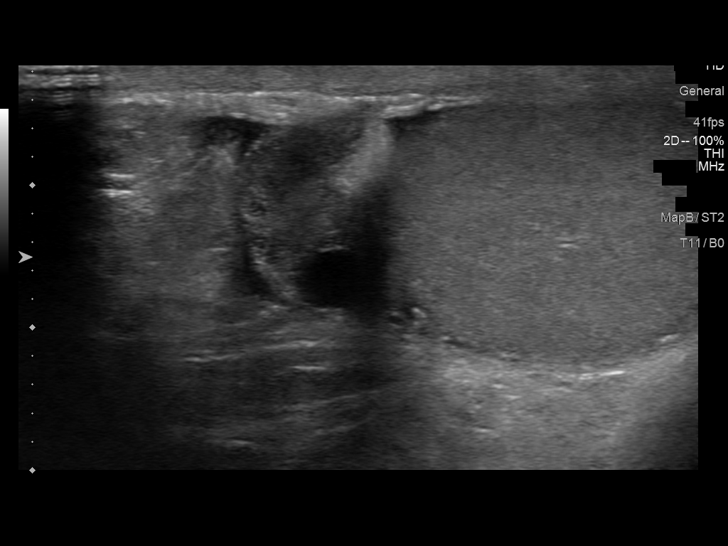
[im 24/95]
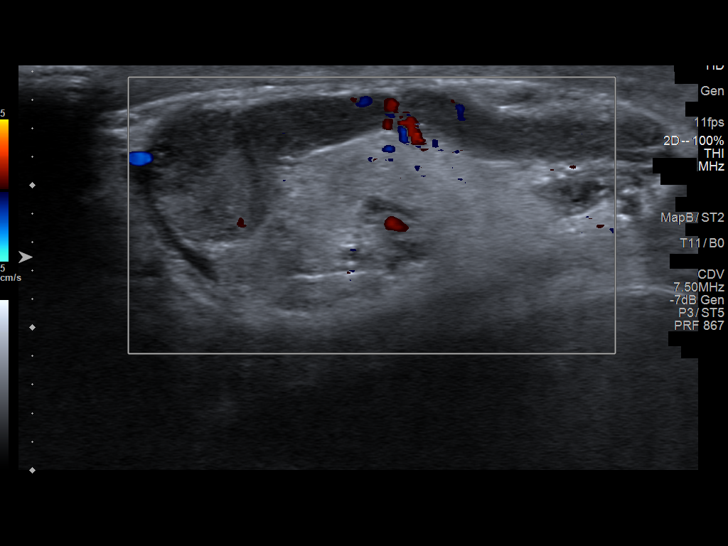
[im 32/95]
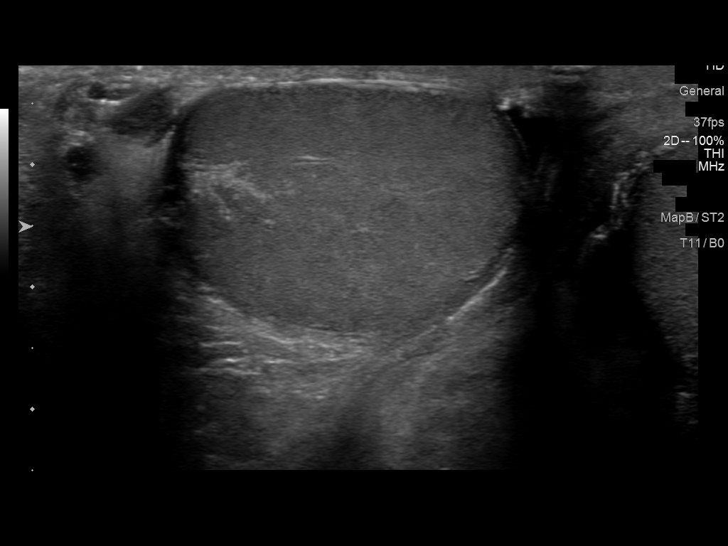
[im 40/95]
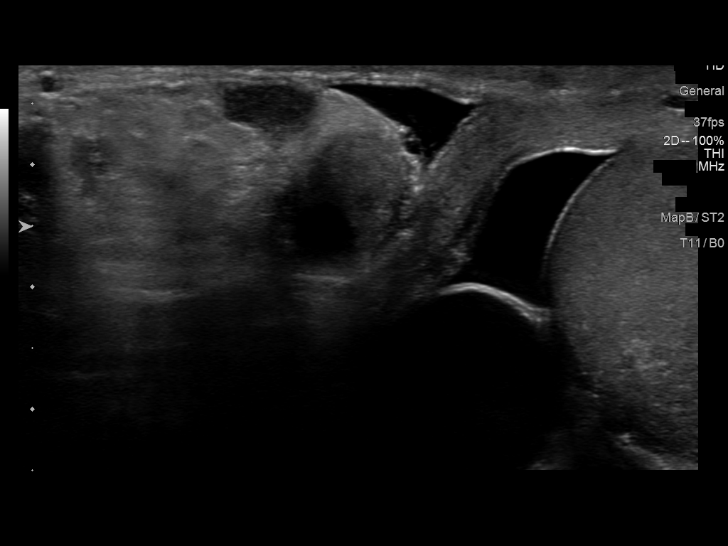
[im 48/95]
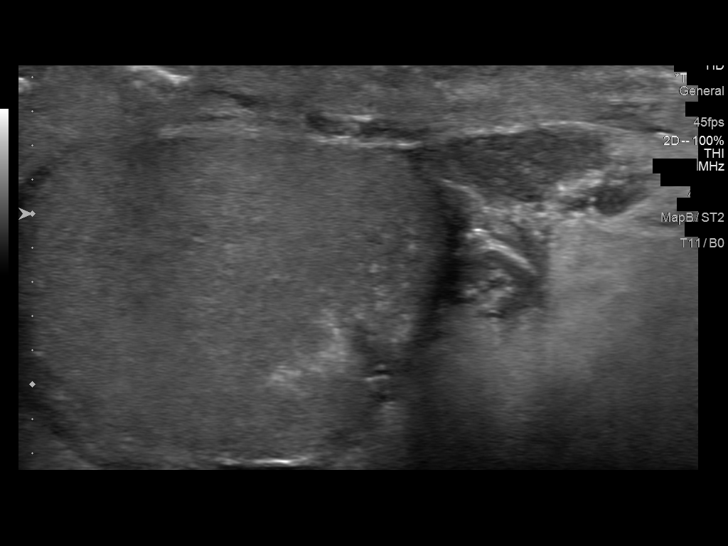
[im 55/95]
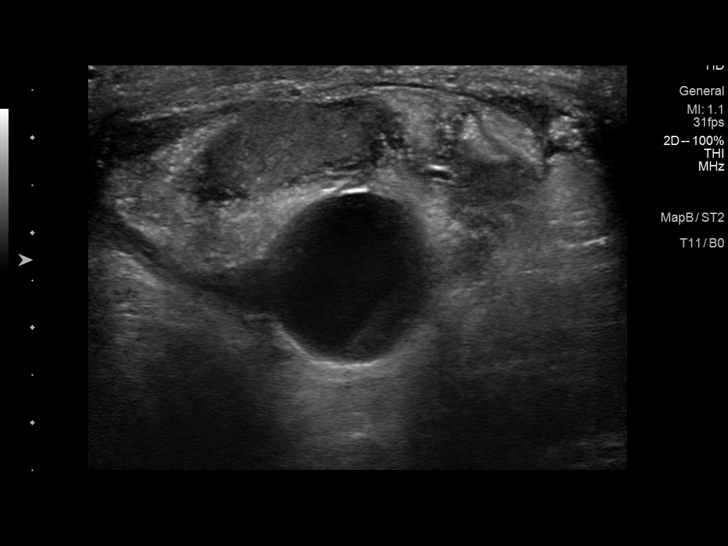
[im 63/95]
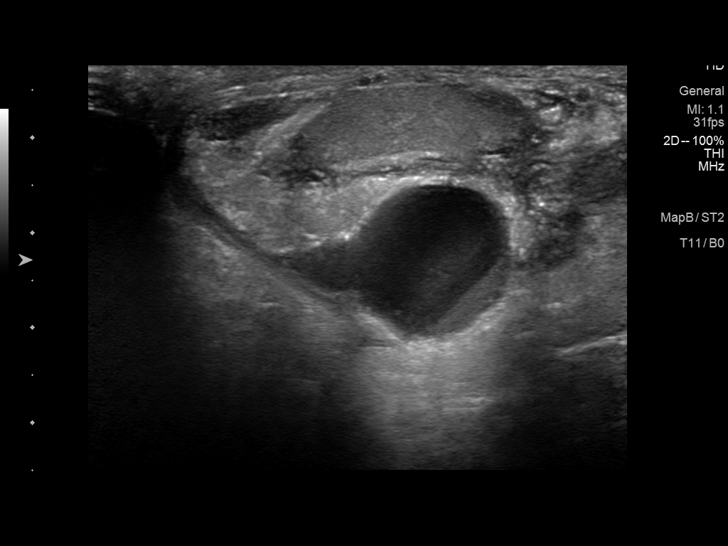
[im 71/95]
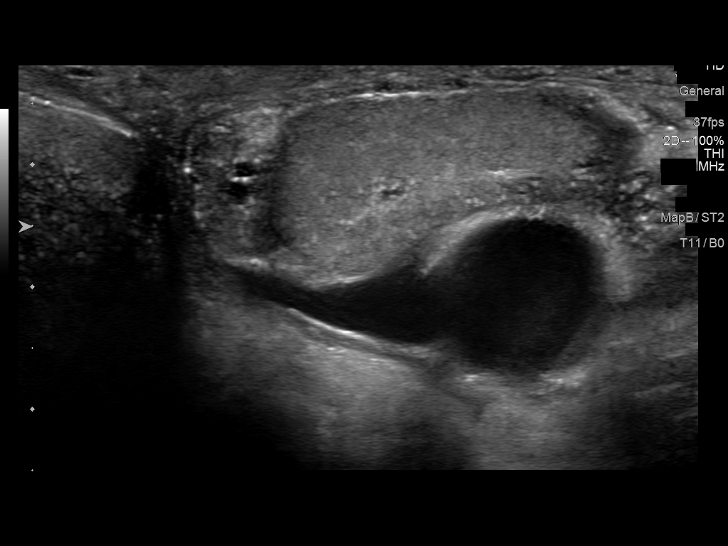
[im 79/95]
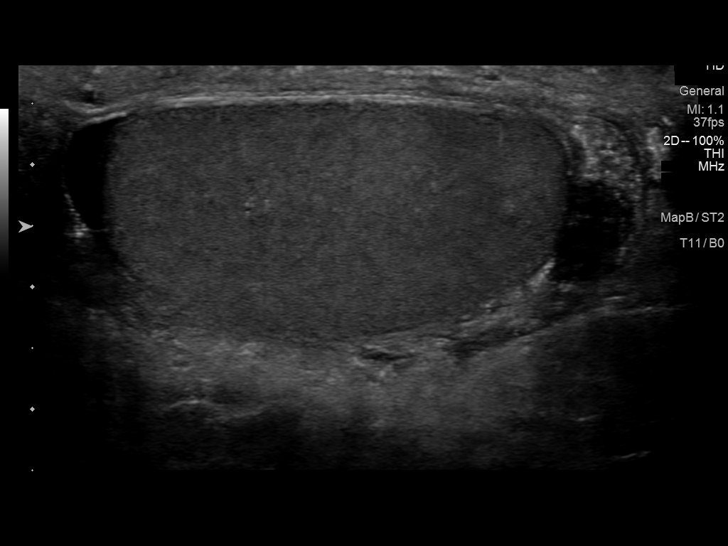
[im 87/95]
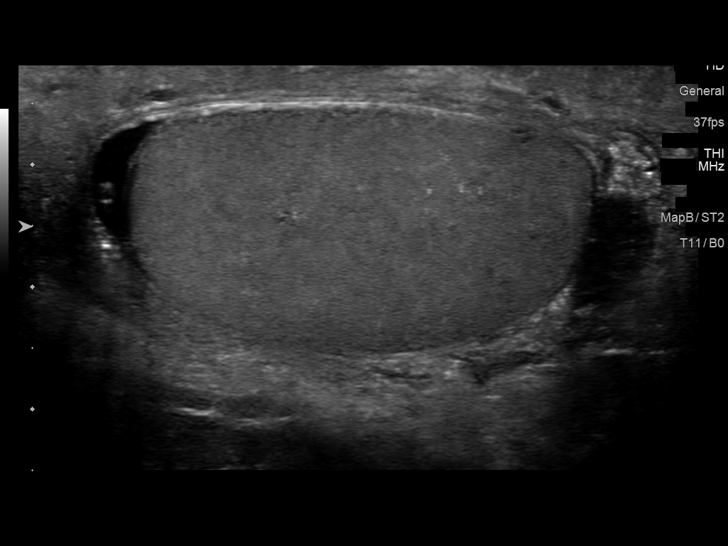
[im 95/95]
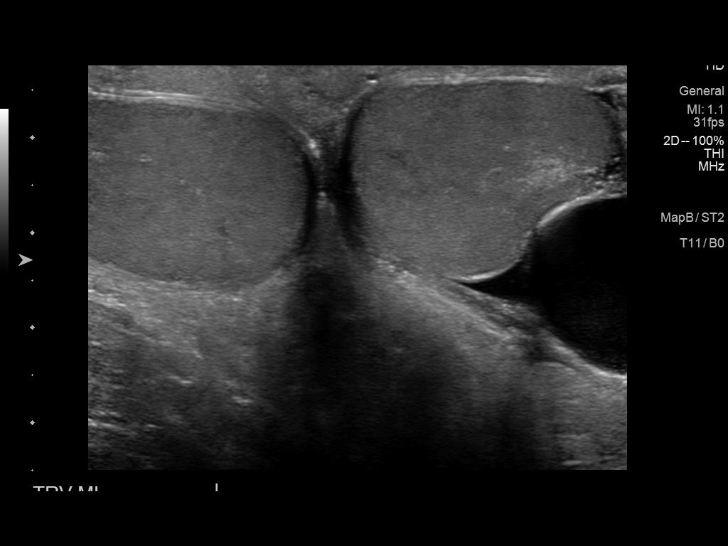

[13 of 25 positions shown; findings below may reference images not displayed]

FINDINGS: Right testicle

Measurements: 38 x 24 x 27 mm. No mass or microlithiasis visualized.

Left testicle

Measurements: 38 x 20 x 30 mm. No mass or microlithiasis visualized.

Right epididymis:  6 mm right epididymal cyst

Left epididymis: 4 mm epididymal cyst. There is also a prominent
volume of echogenic tissue around the left epididymis, which may
represent adipose tissue; it is asymmetric compared to the right
side but not clearly of acute significance.It does not change with
Valsalva, as would be expected of a fat-containing inguinal hernia.

Hydrocele:  Small bilateral hydroceles

Varicocele:  None visualized.

Pulsed Doppler interrogation of both testes demonstrates low
resistance arterial and venous waveforms bilaterally.

Other findings: There is a complex cystic structure posterior and
lateral to the left testicle, separate from the testicle and also
not clearly related to the left epididymis. There is complex fluid
within the structure with some mild debris layering dependently. It
measures 19 x 19 x 18mm.
IMPRESSION: 1.  No evidence of torsion, orchitis, or epididymitis.

2. 19mm cystic structure in the left hemiscrotum, possibly a tunica
albuginea cyst or other similar finding not of acute origin.

3. Asymmetric adipose tissue in left scrotum adjacent to epididymis,
which does not respond to Valsalva as would be expected of an
inguinal hernia. That remains a possibility that would be better
evaluated with CT scan.

## 2016-03-03 IMAGING — CT CT PELVIS W/ CM
3 series · 13 of 36 positions shown, 19 images · IV contrast (omnipaque)
Comparison: Ultrasound exam 11/10/2014.

CLINICAL DATA: Subsequent encounter for left-sided scrotal cyst
seen on ultrasound of 11/10/2014

EXAM:
CT PELVIS WITH CONTRAST
TECHNIQUE: Multidetector CT imaging of the pelvis was performed using the
standard protocol following the bolus administration of intravenous
contrast.
CONTRAST:  125mL OMNIPAQUE IOHEXOL 300 MG/ML  SOLN

[Series 3: routine pelvis · axial · 0.98mm/px · z∈[-290,-50]mm · 6 of 68 slices shown, 11 images]
[im 10/68  soft-tissue]
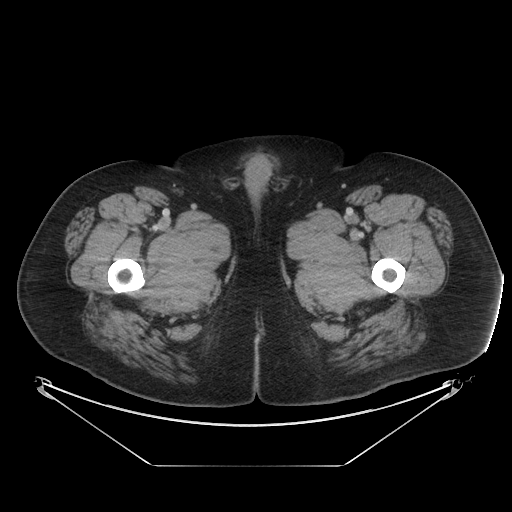
[im 10/68  bone]
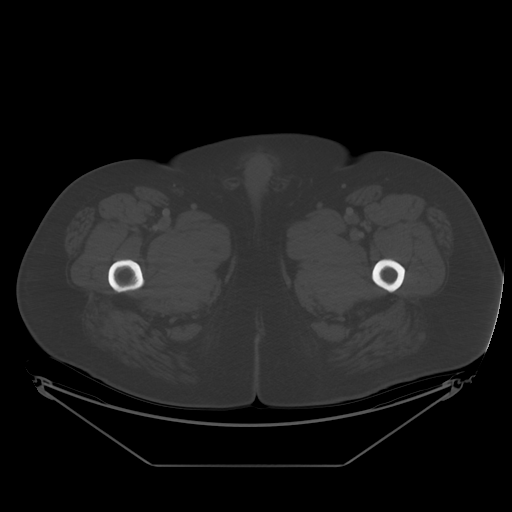
[im 20/68  soft-tissue]
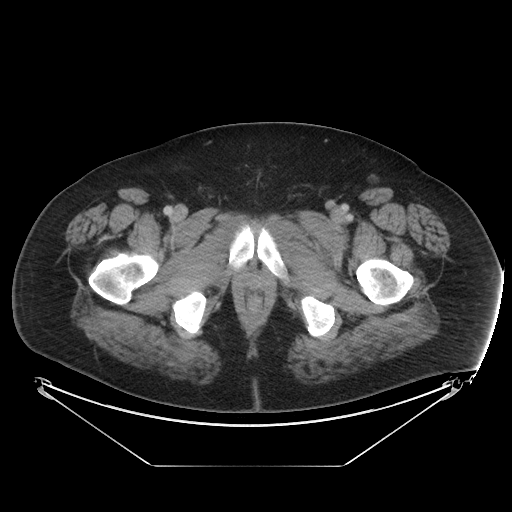
[im 29/68  soft-tissue]
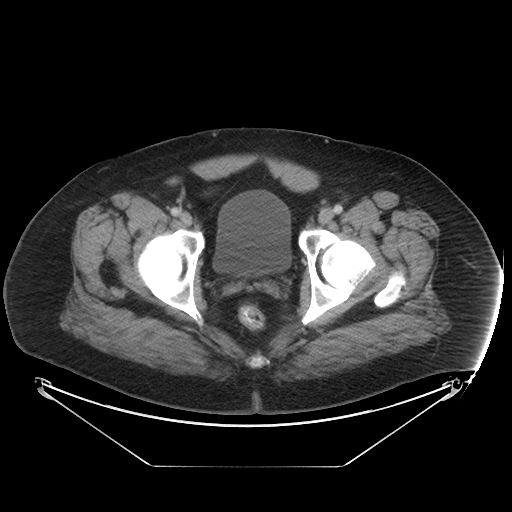
[im 29/68  lung]
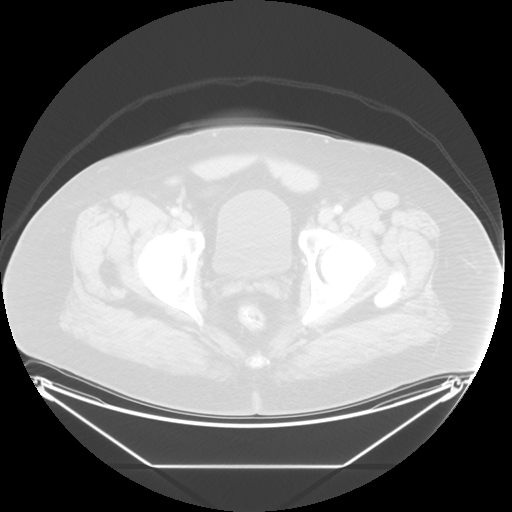
[im 39/68  soft-tissue]
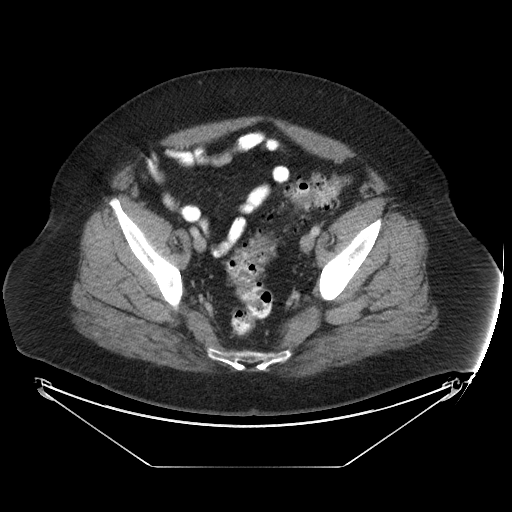
[im 39/68  lung]
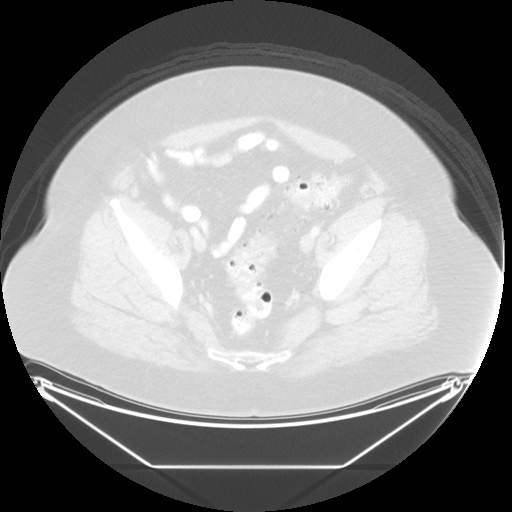
[im 48/68  soft-tissue]
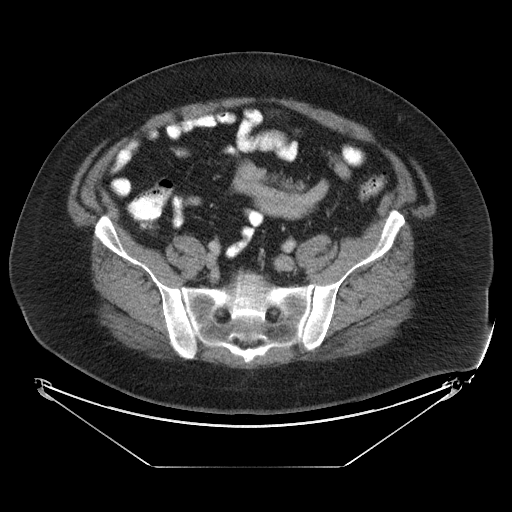
[im 48/68  lung]
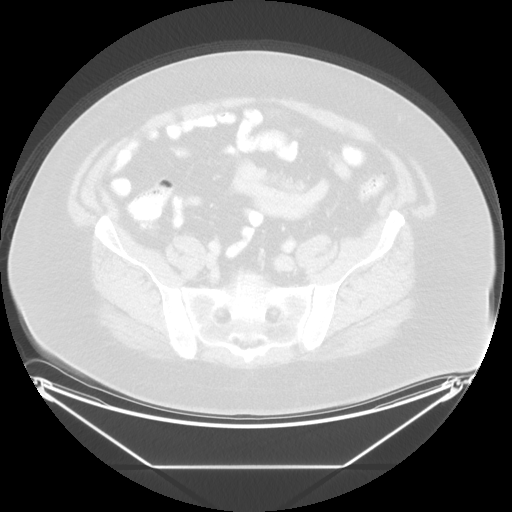
[im 58/68  soft-tissue]
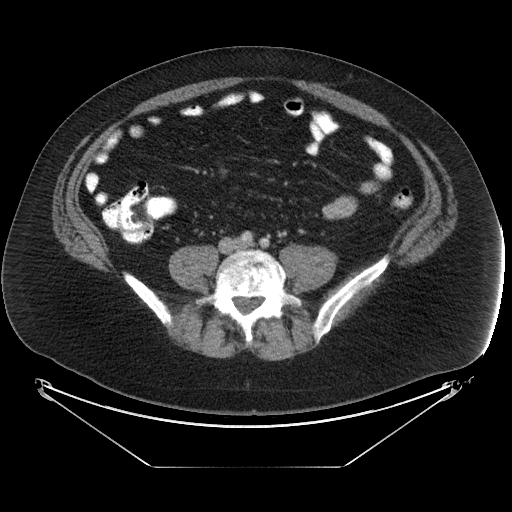
[im 58/68  lung]
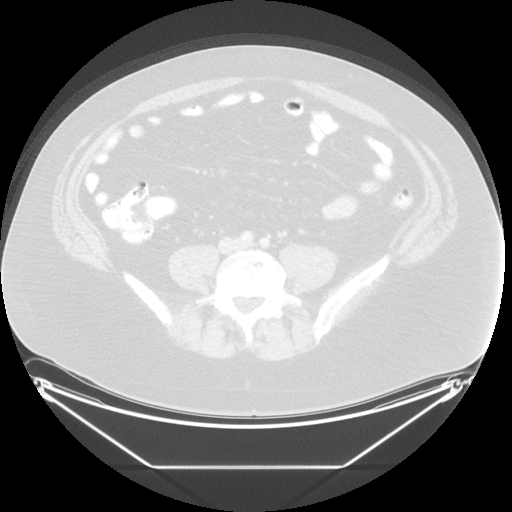

[Series 601: coronal body · coronal · 0.98mm/px · 1 of 167 slices shown, 2 images]
[im 56/167  soft-tissue]
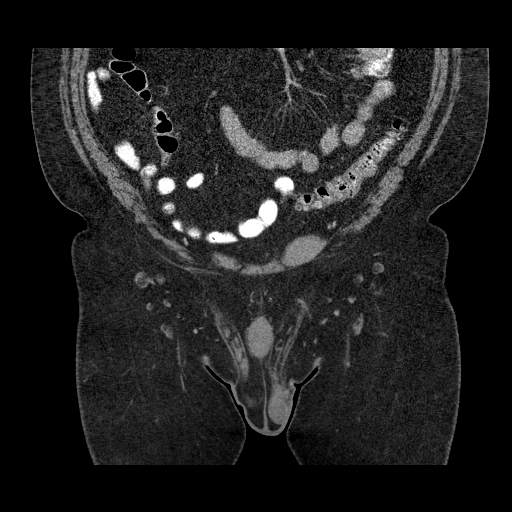
[im 56/167  bone]
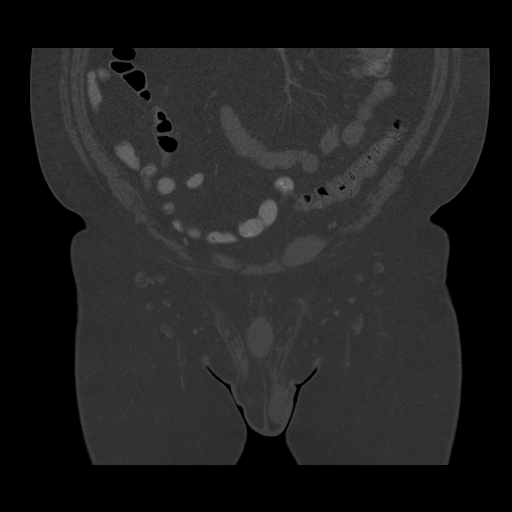

[Series 602: sagittal body · sagittal · 0.98mm/px · 6 of 198 slices shown]
[im 18/198  soft-tissue]
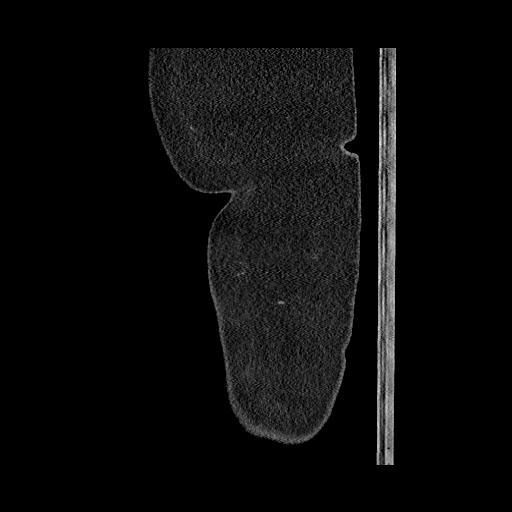
[im 43/198  soft-tissue]
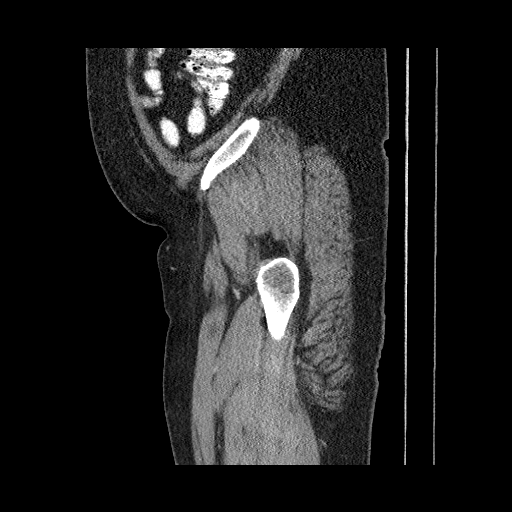
[im 60/198  soft-tissue]
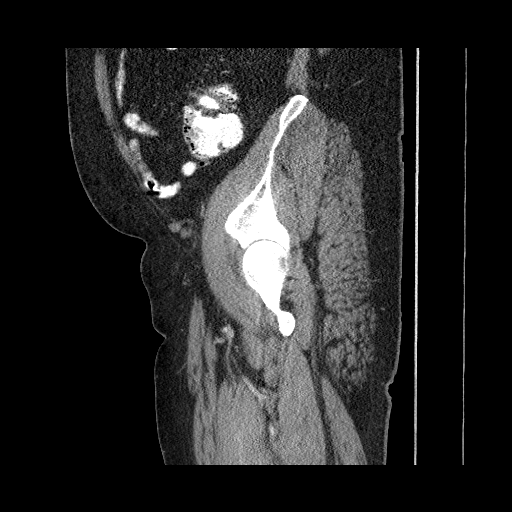
[im 86/198  soft-tissue]
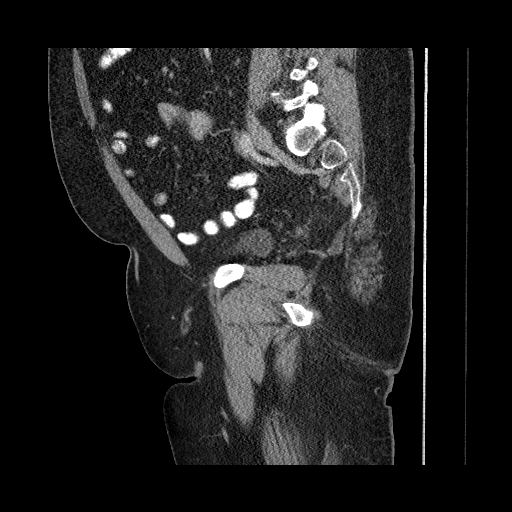
[im 112/198  soft-tissue]
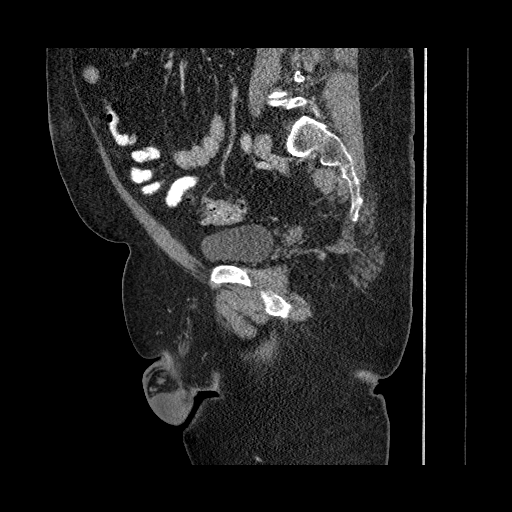
[im 138/198  soft-tissue]
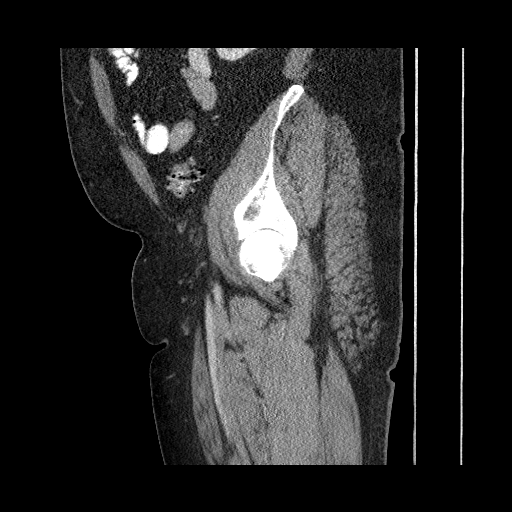

[13 of 36 positions shown; findings below may reference images not displayed]

FINDINGS: There is no pelvic sidewall lymphadenopathy. Diverticular changes
are seen in the sigmoid colon without diverticulitis. Prostate gland
and seminal vesicles have normal imaging features. Terminal ileum is
normal. The appendix is normal.

Bone windows reveal no worrisome lytic or sclerotic osseous lesions.

No evidence for inguinal hernia on either side. No definite cystic
structure evident in either hemiscrotum although this could be a
limitation of CT imaging given the small size of the abnormality on
the previous ultrasound.
IMPRESSION: No evidence for left inguinal hernia.

## 2016-10-20 ENCOUNTER — Emergency Department (INDEPENDENT_AMBULATORY_CARE_PROVIDER_SITE_OTHER)
Admission: EM | Admit: 2016-10-20 | Discharge: 2016-10-20 | Disposition: A | Discharge status: Home / Self Care | Payer: 59 | Source: Home / Self Care | Attending: Family Medicine | Admitting: Family Medicine

## 2016-10-20 ENCOUNTER — Encounter: Payer: Self-pay | Admitting: Emergency Medicine

## 2016-10-20 DIAGNOSIS — J019 Acute sinusitis, unspecified: Secondary | ICD-10-CM | POA: Diagnosis not present

## 2016-10-20 MED ORDER — AMOXICILLIN-POT CLAVULANATE 875-125 MG PO TABS: 1.0000 | ORAL_TABLET | Freq: Two times a day (BID) | ORAL | 0 refills | Status: DC

## 2016-10-20 MED ORDER — IPRATROPIUM BROMIDE 0.06 % NA SOLN: 2.0000 | Freq: Four times a day (QID) | NASAL | 0 refills | Status: AC

## 2016-10-20 NOTE — Discharge Instructions (Signed)
For the Ipratropium nasal spray, be sure to use as prescribed to help prevent post-nasal drip, which can trigger coughing, especially at night.  Use 2 sprays per nostril 4 times daily while sick.  You should spray one time in each nostril pointing the spray to the out portion of your nostril, breath in slowly while spraying. Wait about 30 seconds to 1 minute before given the second spray in each nostril.  This will ensure you get the most benefit from each spray.   If you develop too much dryness in your nose, you may lower the frequency you use the medication to once or twice daily.

## 2016-10-20 NOTE — ED Provider Notes (Signed)
CSN: [id number]     Arrival date & time 10/20/16  0808 History   First MD Initiated Contact with Patient 10/20/16 [phone]     Chief Complaint  Patient presents with  . Sinus Problem   (Consider location/radiation/quality/duration/timing/severity/associated sxs/prior Treatment) HPI  Ruben Reeves is a 55 y.o. male presenting to UC with c/o 5 days of worsening sinus pain, pressure and thick yellow nasal discharge with post-nasal drip and mild intermittent minimally productive cough from post-nasal drip.  Denies fever, chills, n/v/d. Denies known sick contacts.  He of sinus infections a few years ago, symptoms feel similar.  Due to hx of high blood pressure, pt is hesitant taking any OTC medications. He has used nasal saline.    Past Medical History:  Diagnosis Date  . Hypercholesteremia   . Hypertension    Past Surgical History:  Procedure Laterality Date  . INGUINAL HERNIA REPAIR    . WISDOM TOOTH EXTRACTION     Family History  Problem Relation Age of Onset  . Colon polyps Mother   . Bladder Cancer Father   . Kidney disease Father   . Colon polyps Father   . Heart disease Maternal Grandmother   . Stroke Maternal Grandfather   . Heart disease Paternal Grandmother   . Stroke Paternal Grandfather   . Colon cancer Neg Hx   . Pancreatic cancer Neg Hx   . Rectal cancer Neg Hx   . Stomach cancer Neg Hx    Social History  Substance Use Topics  . Smoking status: Former Smoker    Quit date: 06/29/1985  . Smokeless tobacco: Never Used  . Alcohol use 0.6 oz/week    1 Glasses of wine per week    Review of Systems  Constitutional: Negative for chills and fever.  HENT: Positive for congestion, postnasal drip, sinus pain, sinus pressure and sore throat (minimal). Negative for ear pain, trouble swallowing and voice change.   Respiratory: Positive for cough ( minimal). Negative for shortness of breath.   Cardiovascular: Negative for chest pain and palpitations.  Gastrointestinal: Negative  for abdominal pain, diarrhea, nausea and vomiting.  Musculoskeletal: Negative for arthralgias, back pain and myalgias.  Skin: Negative for rash.  Neurological: Positive for headaches. Negative for dizziness and light-headedness ( frontal).    Allergies  Sulfa antibiotics  Home Medications   Prior to Admission medications   Medication Sig Start Date End Date Taking? Authorizing Provider  amoxicillin-clavulanate (AUGMENTIN) 875-125 MG tablet Take 1 tablet by mouth 2 (two) times daily. One po bid x 7 days 10/20/16   Noland Fordyce, PA-C  aspirin 81 MG tablet Take 81 mg by mouth daily.    Historical Provider, MD  cholecalciferol (VITAMIN D) 1000 UNITS tablet Take 1,000 Units by mouth 2 (two) times daily.    Historical Provider, MD  Coenzyme Q10 (CO Q 10 PO) Take 200 mg by mouth daily.    Historical Provider, MD  hydrochlorothiazide (HYDRODIURIL) 25 MG tablet Take 25 mg by mouth daily.    Historical Provider, MD  ipratropium (ATROVENT) 0.06 % nasal spray Place 2 sprays into both nostrils 4 (four) times daily. 10/20/16   Noland Fordyce, PA-C  lisinopril (PRINIVIL,ZESTRIL) 20 MG tablet Take 20 mg by mouth daily.    Historical Provider, MD  Magnesium Oxide (MAG-CAPS PO) Take by mouth. mag citrate 400mg  po daily    Historical Provider, MD  Multiple Vitamin (MULTIVITAMIN) tablet Take 1 tablet by mouth daily.    Historical Provider, MD  Omega-3 Fatty Acids ([place]  OIL) 1200 MG CPDR Take 1,200 mg by mouth 2 (two) times daily.    Historical Provider, MD  simvastatin (ZOCOR) 20 MG tablet Take 20 mg by mouth daily.    Historical Provider, MD  TURMERIC PO Take 1,160 mg by mouth daily.    Historical Provider, MD  vitamin C (ASCORBIC ACID) 500 MG tablet Take 500 mg by mouth daily.    Historical Provider, MD   Meds Ordered and Administered this Visit  Medications - No data to display  BP 136/83 (BP Location: Left Arm)   Pulse 86   Temp 98.6 F (37 C) (Oral)   Ht 6\' 1"  (1.854 m)   Wt 295 lb (133.8 kg)    SpO2 97%   BMI 38.92 kg/m  No data found.   Physical Exam  Constitutional: He is oriented to person, place, and time. He appears well-developed and well-nourished. No distress.  HENT:  Head: Normocephalic and atraumatic.  Right Ear: Tympanic membrane normal.  Left Ear: Tympanic membrane normal.  Nose: Mucosal edema present. Right sinus exhibits maxillary sinus tenderness and frontal sinus tenderness. Left sinus exhibits maxillary sinus tenderness and frontal sinus tenderness.  Eyes: EOM are normal.  Neck: Normal range of motion. Neck supple.  Cardiovascular: Normal rate and regular rhythm.   Pulmonary/Chest: Effort normal and breath sounds normal. No stridor. No respiratory distress. He has no wheezes. He has no rales.  Musculoskeletal: Normal range of motion.  Lymphadenopathy:    He has no cervical adenopathy.  Neurological: He is alert and oriented to person, place, and time.  Skin: Skin is warm and dry. He is not diaphoretic.  Psychiatric: He has a normal mood and affect. His behavior is normal.  Nursing note and vitals reviewed.   Urgent Care Course   Clinical Course     Procedures (including critical care time)  Labs Review Labs Reviewed - No data to display  Imaging Review No results found.    MDM   1. Acute rhinosinusitis    Pt c/o gradually worsening sinus pain and pressure with congestion. Sinus tenderness noted on exam.  Rx: Augmentin and ipratropium nasal spray for nasal congestion. Encouraged sinus rinses, and acetaminophen and ibuprofen as needed for fever or pain. F/u with PCP in 7-10 days if not improving.     Noland Fordyce, PA-C 10/20/16 [id number]

## 2016-10-20 NOTE — ED Triage Notes (Signed)
Sinus pain, pressure, headache, runny nose, congestion, yellow-green mucus x 5 days

## 2019-06-10 ENCOUNTER — Encounter: Payer: Self-pay | Admitting: Internal Medicine

## 2021-03-26 ENCOUNTER — Other Ambulatory Visit: Payer: Self-pay | Admitting: Behavioral Health

## 2021-03-26 ENCOUNTER — Ambulatory Visit (INDEPENDENT_AMBULATORY_CARE_PROVIDER_SITE_OTHER): Payer: 59 | Admitting: Behavioral Health

## 2021-03-26 ENCOUNTER — Other Ambulatory Visit: Payer: Self-pay

## 2021-03-26 ENCOUNTER — Encounter: Payer: Self-pay | Admitting: Behavioral Health

## 2021-03-26 VITALS — BP 134/74 | HR 69 | Ht 71.0 in | Wt 247.0 lb

## 2021-03-26 DIAGNOSIS — F330 Major depressive disorder, recurrent, mild: Secondary | ICD-10-CM | POA: Diagnosis not present

## 2021-03-26 MED ORDER — BUPROPION HCL ER (XL) 150 MG PO TB24: 150.0000 mg | ORAL_TABLET | Freq: Every day | ORAL | 1 refills | Status: DC

## 2021-03-26 NOTE — Progress Notes (Addendum)
[facility] MD/PA/NP Initial Note  03/26/2021 8:49 AM Ruben Reeves  MRN:  [id number]  Chief Complaint:  Chief Complaint    Depression; Establish Care      HPI: 60 year old male presents to this office for initial visit and to establish care. He say, " I just have lack of motivation and drive, and not where would like to be". Says that he was patient of Dr. Clovis Pu and Dr. Gaylan Gerold in this practice many years ago. Says that he works 60-80 hours per week. Says that he and his wife have not got out of the house since when Covid began in 2020. Says he prefers to live life in solitude with just him and his wife. He says this is the first appointment that he has gotten out to since Covid.  Even thought he has no problems speaking to crowds of people, he chooses not to socialize after work. He says that his anxiety is 3 and depression is 4. He says that he has functioned on 5-6 hours of sleep for many years. Says he was on Wellbutrin and testosterone supplement several times over the years but would stop medication when feeling better. Says he feels like he is at the place where he needs to start them again. Says that spouse is very supportive of him but does miss the intimacy of touch. He says that he does want to improve in this area as well. Reports no mania, no auditory or visual hallucinations. No SI/HI.  Visit Diagnosis:    ICD-10-CM   1. Mild episode of recurrent major depressive disorder (HCC)  F33.0 buPROPion (WELLBUTRIN XL) 150 MG 24 hr tablet    Past Psychiatric History:   Past Medical History:  Past Medical History:  Diagnosis Date  . Hypercholesteremia   . Hypertension     Past Surgical History:  Procedure Laterality Date  . INGUINAL HERNIA REPAIR    . WISDOM TOOTH EXTRACTION      Family Psychiatric History:see chart  Family History:  Family History  Problem Relation Age of Onset  . Colon polyps Mother   . Bladder Cancer Father   . Kidney disease Father   . Colon polyps  Father   . Heart disease Maternal Grandmother   . Stroke Maternal Grandfather   . Heart disease Paternal Grandmother   . Stroke Paternal Grandfather   . Colon cancer Neg Hx   . Pancreatic cancer Neg Hx   . Rectal cancer Neg Hx   . Stomach cancer Neg Hx     Social History:  Social History   Socioeconomic History  . Marital status: Married    Spouse name: Ivin Booty  . Number of children: 1  . Years of education: Not on file  . Highest education level: Associate degree: academic program  Occupational History  . Occupation: Merchant navy officer Relationship    Comment: [employer]  Tobacco Use  . Smoking status: Former Smoker    Quit date: 06/29/1985    Years since quitting: 35.7  . Smokeless tobacco: Never Used  Substance and Sexual Activity  . Alcohol use: Yes    Alcohol/week: 1.0 standard drink    Types: 1 Glasses of wine per week  . Drug use: No  . Sexual activity: Not Currently    Comment: no intimacy in 8 years  Other Topics Concern  . Not on file  Social History Narrative   Lives with wife in [place]. Likes to live solitude and does not like to socialize  outside of work. Not been on vacation since 2012. Not gone outside home except this visit since 2020 due to covid and wife not having vaccine.    Social Determinants of Health   Financial Resource Strain: Not on file  Food Insecurity: Not on file  Transportation Needs: Not on file  Physical Activity: Not on file  Stress: Not on file  Social Connections: Not on file    Allergies:  Allergies  Allergen Reactions  . Sulfa Antibiotics Rash    redness    Metabolic Disorder Labs: No results found for: HGBA1C, MPG No results found for: PROLACTIN No results found for: CHOL, TRIG, HDL, CHOLHDL, VLDL, LDLCALC No results found for: TSH  Therapeutic Level Labs: No results found for: LITHIUM No results found for: VALPROATE No components found for:  CBMZ  Current Medications: Current Outpatient  Medications  Medication Sig Dispense Refill  . aspirin 81 MG tablet Take 81 mg by mouth daily.    Marland Kitchen buPROPion (WELLBUTRIN XL) 150 MG 24 hr tablet Take 1 tablet (150 mg total) by mouth daily. 30 tablet 1  . cholecalciferol (VITAMIN D) 1000 UNITS tablet Take 1,000 Units by mouth 2 (two) times daily.    . Coenzyme Q10 (CO Q 10 PO) Take 200 mg by mouth daily.    . hydrochlorothiazide (HYDRODIURIL) 25 MG tablet Take 25 mg by mouth daily.    Marland Kitchen lisinopril (PRINIVIL,ZESTRIL) 20 MG tablet Take 20 mg by mouth daily.    . Multiple Vitamin (MULTIVITAMIN) tablet Take 1 tablet by mouth daily.    . Omega-3 Fatty Acids (FISH OIL) 1200 MG CPDR Take 1,200 mg by mouth 2 (two) times daily.    . simvastatin (ZOCOR) 20 MG tablet Take 20 mg by mouth daily.    . TURMERIC PO Take 1,160 mg by mouth daily.    . vitamin C (ASCORBIC ACID) 500 MG tablet Take 500 mg by mouth daily.    Marland Kitchen ipratropium (ATROVENT) 0.06 % nasal spray Place 2 sprays into both nostrils 4 (four) times daily. (Patient not taking: Reported on 03/26/2021) 15 mL 0  . Magnesium Oxide (MAG-CAPS PO) Take by mouth. mag citrate 400mg  po daily (Patient not taking: Reported on 03/26/2021)     No current facility-administered medications for this visit.    Medication Side Effects: none   Orders placed this visit:  No orders of the defined types were placed in this encounter.   Psychiatric Specialty Exam:  Review of Systems  Blood pressure 134/74, pulse 69, height 5\' 11"  (1.803 m), weight 247 lb (112 kg).Body mass index is 34.45 kg/m.  General Appearance: Casual and Neat  Eye Contact:  Good  Speech:  Clear and Coherent  Volume:  Normal  Mood:  Depressed  Affect:  Appropriate  Thought Process:  Coherent  Orientation:  Full (Time, Place, and Person)  Thought Content: Logical   Suicidal Thoughts:  No  Homicidal Thoughts:  No  Memory:  WNL  Judgement:  Good  Insight:  Good  Psychomotor Activity:  Normal  Concentration:  Concentration: Good   Recall:  Good  Fund of Knowledge: Good  Language: Good  Assets:  Desire for Improvement  ADL's:  Intact  Cognition: WNL  Prognosis:  Good   Screenings:  PHQ2-9   [place] Office Visit from 03/26/2021 in [facility]  PHQ-2 Total Score 2  PHQ-9 Total Score 3      Receiving Psychotherapy: No   Treatment Plan/Recommendations:  Will start Wellbutrin 150 mg XL  daily To report side effects or worsening symptoms Provided after hours emergency contact info To follow up in four weeks to reassess. Greater than 50% of 45 min.face to face time with patient was spent on counseling and coordination of care. We discussed healthy coping skills and provided light counseling on stages in life/reflection. Discussed work/life balance and relationship needs. Patient desired to go back on medication that worked for him in the past. He said he has appointment with urology and will f/u for testosterone therapy.     Elwanda Brooklyn, NP

## 2021-04-23 ENCOUNTER — Other Ambulatory Visit: Payer: Self-pay

## 2021-04-23 ENCOUNTER — Ambulatory Visit (INDEPENDENT_AMBULATORY_CARE_PROVIDER_SITE_OTHER): Payer: 59 | Admitting: Behavioral Health

## 2021-04-23 ENCOUNTER — Encounter: Payer: Self-pay | Admitting: Behavioral Health

## 2021-04-23 DIAGNOSIS — F330 Major depressive disorder, recurrent, mild: Secondary | ICD-10-CM | POA: Diagnosis not present

## 2021-04-23 MED ORDER — BUPROPION HCL ER (XL) 300 MG PO TB24: 300.0000 mg | ORAL_TABLET | Freq: Every day | ORAL | 1 refills | Status: DC

## 2021-04-23 NOTE — Progress Notes (Signed)
[facility] Med Check  Patient ID: Ruben Reeves,  MRN: [id number]  PCP: Burnard Bunting, MD  Date of Evaluation: 04/23/2021 Time spent:15 minutes  Chief Complaint:  Chief Complaint   Depression; Follow-up; Medication Refill     HISTORY/CURRENT STATUS: HPI  60 year old male presents to this office for follow up and medication management. He says that he is doing very well on the Wellbutrin 150 mg XL daily but feels he still needs a medication adjustment. He has been on Wellbutrin in the past. He says that he notices subtle changes in mood later in the day. Says his wife has noticed improvement in his mood and sense of well being. Says that the improvement is helping in his marital relationship. He reports anxiety today 0 and depression 2. Says he is sleeping 7-8 hours per night. No mania, no psychosis. No SI/HI.   No prior psychiatric medication failures    Individual Medical History/ Review of Systems: Changes? :No   Allergies: Sulfa antibiotics  Current Medications:  Current Outpatient Medications:    aspirin 81 MG tablet, Take 81 mg by mouth daily., Disp: , Rfl:    buPROPion (WELLBUTRIN XL) 300 MG 24 hr tablet, Take 1 tablet (300 mg total) by mouth daily., Disp: 90 tablet, Rfl: 1   cholecalciferol (VITAMIN D) 1000 UNITS tablet, Take 1,000 Units by mouth 2 (two) times daily., Disp: , Rfl:    Coenzyme Q10 (CO Q 10 PO), Take 200 mg by mouth daily., Disp: , Rfl:    hydrochlorothiazide (HYDRODIURIL) 25 MG tablet, Take 25 mg by mouth daily., Disp: , Rfl:    lisinopril (PRINIVIL,ZESTRIL) 20 MG tablet, Take 20 mg by mouth daily., Disp: , Rfl:    Multiple Vitamin (MULTIVITAMIN) tablet, Take 1 tablet by mouth daily., Disp: , Rfl:    Omega-3 Fatty Acids (FISH OIL) 1200 MG CPDR, Take 1,200 mg by mouth 2 (two) times daily., Disp: , Rfl:    simvastatin (ZOCOR) 20 MG tablet, Take 20 mg by mouth daily., Disp: , Rfl:    vitamin C (ASCORBIC ACID) 500 MG tablet, Take 500 mg by mouth  daily., Disp: , Rfl:    ipratropium (ATROVENT) 0.06 % nasal spray, Place 2 sprays into both nostrils 4 (four) times daily. (Patient not taking: No sig reported), Disp: 15 mL, Rfl: 0   Magnesium Oxide (MAG-CAPS PO), Take by mouth. mag citrate 400mg  po daily (Patient not taking: No sig reported), Disp: , Rfl:    TURMERIC PO, Take 1,160 mg by mouth daily. (Patient not taking: Reported on 04/23/2021), Disp: , Rfl:  Medication Side Effects: none  Family Medical/ Social History: Changes? No  MENTAL HEALTH EXAM:  There were no vitals taken for this visit.There is no height or weight on file to calculate BMI.  General Appearance: Casual, Neat, and Well Groomed  Eye Contact:  NA  Speech:  Clear and Coherent  Volume:  Normal  Mood:  NA  Affect:  Appropriate  Thought Process:  Coherent  Orientation:  Full (Time, Place, and Person)  Thought Content: Logical   Suicidal Thoughts:  No  Homicidal Thoughts:  No  Memory:  WNL  Judgement:  Good  Insight:  Good  Psychomotor Activity:  Normal  Concentration:  Concentration: Good  Recall:  Good  Fund of Knowledge: Good  Language: Good  Assets:  Desire for Improvement  ADL's:  Intact  Cognition: WNL  Prognosis:  Good    DIAGNOSES:    ICD-10-CM   1. Mild episode of recurrent  major depressive disorder (HCC)  F33.0 buPROPion (WELLBUTRIN XL) 300 MG 24 hr tablet      Receiving Psychotherapy: No    RECOMMENDATIONS:   Will increase Wellbutrin to 300 mg XL daily To report side effects or worsening symptoms Provided after hours emergency contact info To follow up in four weeks to reassess. Greater than 50% of 20 min.face to face time with patient was spent on counseling and coordination of care. Discussed continued practice of good coping skills. Discussed need for socialization. Patient is progressing well, with noticeable improvement in depression. Pt  did request increase in dosage because he felt although improved was not not quiet where he  should be. He has been on Wellbutrin in the past. Change of dosage and refill was escribed to pharmacy.  Elwanda Brooklyn, NP

## 2021-07-24 ENCOUNTER — Ambulatory Visit (INDEPENDENT_AMBULATORY_CARE_PROVIDER_SITE_OTHER): Payer: 59 | Admitting: Behavioral Health

## 2021-07-24 ENCOUNTER — Other Ambulatory Visit: Payer: Self-pay

## 2021-07-24 ENCOUNTER — Encounter: Payer: Self-pay | Admitting: Behavioral Health

## 2021-07-24 DIAGNOSIS — F330 Major depressive disorder, recurrent, mild: Secondary | ICD-10-CM | POA: Diagnosis not present

## 2021-07-24 MED ORDER — BUPROPION HCL ER (SR) 150 MG PO TB12
150.0000 mg | ORAL_TABLET | Freq: Two times a day (BID) | ORAL | 3 refills | Status: DC
Start: 2021-07-24 — End: 2021-08-16

## 2021-07-24 NOTE — Progress Notes (Signed)
[facility] Check  Patient ID: Ruben Reeves,  MRN: [id number]  PCP: Burnard Bunting, MD  Date of Evaluation: 07/24/2021 Time spent:20 minutes  Chief Complaint:  Chief Complaint   Depression; Follow-up; Medication Problem; Medication Refill     HISTORY/CURRENT STATUS: HPI 60 year old male presents to this office for follow up and medication management. He says he is doing well for the most part but experiences crash in the late afternoon and feels more "blue". He says that he would like to try going back on the SR version and splitting the dose to take twice per day. He says that he notices subtle changes in mood later in the day. Says his wife has also noticed the difference in the evening hours. He reports anxiety today 0 and depression 4/10. Says he is sleeping 7-8 hours per night. No mania, no psychosis. No SI/HI.   No previous medication trials.   Individual Medical History/ Review of Systems: Changes? :No   Allergies: Sulfa antibiotics  Current Medications:  Current Outpatient Medications:    buPROPion (WELLBUTRIN SR) 150 MG 12 hr tablet, Take 1 tablet (150 mg total) by mouth 2 (two) times daily., Disp: 60 tablet, Rfl: 3   aspirin 81 MG tablet, Take 81 mg by mouth daily., Disp: , Rfl:    buPROPion (WELLBUTRIN XL) 300 MG 24 hr tablet, Take 1 tablet (300 mg total) by mouth daily., Disp: 90 tablet, Rfl: 1   cholecalciferol (VITAMIN D) 1000 UNITS tablet, Take 1,000 Units by mouth 2 (two) times daily., Disp: , Rfl:    Coenzyme Q10 (CO Q 10 PO), Take 200 mg by mouth daily., Disp: , Rfl:    hydrochlorothiazide (HYDRODIURIL) 25 MG tablet, Take 25 mg by mouth daily., Disp: , Rfl:    ipratropium (ATROVENT) 0.06 % nasal spray, Place 2 sprays into both nostrils 4 (four) times daily. (Patient not taking: No sig reported), Disp: 15 mL, Rfl: 0   lisinopril (PRINIVIL,ZESTRIL) 20 MG tablet, Take 20 mg by mouth daily., Disp: , Rfl:    Magnesium Oxide (MAG-CAPS PO), Take by mouth. mag  citrate '400mg'$  po daily (Patient not taking: No sig reported), Disp: , Rfl:    Multiple Vitamin (MULTIVITAMIN) tablet, Take 1 tablet by mouth daily., Disp: , Rfl:    Omega-3 Fatty Acids (FISH OIL) 1200 MG CPDR, Take 1,200 mg by mouth 2 (two) times daily., Disp: , Rfl:    simvastatin (ZOCOR) 20 MG tablet, Take 20 mg by mouth daily., Disp: , Rfl:    TURMERIC PO, Take 1,160 mg by mouth daily. (Patient not taking: Reported on 04/23/2021), Disp: , Rfl:    vitamin C (ASCORBIC ACID) 500 MG tablet, Take 500 mg by mouth daily., Disp: , Rfl:  Medication Side Effects: none  Family Medical/ Social History: Changes? No  MENTAL HEALTH EXAM:  There were no vitals taken for this visit.There is no height or weight on file to calculate BMI.  General Appearance: Casual and Neat  Eye Contact:  Good  Speech:  Clear and Coherent  Volume:  Normal  Mood:  NA  Affect:  Appropriate  Thought Process:  Coherent  Orientation:  Full (Time, Place, and Person)  Thought Content: Logical   Suicidal Thoughts:  No  Homicidal Thoughts:  No  Memory:  WNL  Judgement:  Good  Insight:  Good  Psychomotor Activity:  Normal  Concentration:  Concentration: Good  Recall:  Good  Fund of Knowledge: Good  Language: Good  Assets:  Desire for Improvement  ADL's:  Intact  Cognition: WNL  Prognosis:  Good    DIAGNOSES:    ICD-10-CM   1. Mild episode of recurrent major depressive disorder (HCC)  F33.0 buPROPion (WELLBUTRIN SR) 150 MG 12 hr tablet      Receiving Psychotherapy: No    RECOMMENDATIONS:   Will change Wellbutrin to 300 mg SR twice  daily To report side effects or worsening symptoms Provided after hours emergency contact info To follow up in four weeks to reassess. Greater than 50% of 20 min.face to face time with patient was spent on counseling and coordination of care. Discussed continued practice of good coping skills. Discussed need for socialization. Patient is progressing well, with noticeable  improvement in depression. Pt wanted to change back to SR because he says he felt some crash in the evening and would like to spread dose out. Change of dosage and refill was escribed to pharmacy.           Elwanda Brooklyn, NP

## 2021-08-15 ENCOUNTER — Other Ambulatory Visit: Payer: Self-pay | Admitting: Behavioral Health

## 2021-08-15 DIAGNOSIS — F330 Major depressive disorder, recurrent, mild: Secondary | ICD-10-CM

## 2021-10-23 ENCOUNTER — Encounter: Payer: Self-pay | Admitting: Behavioral Health

## 2021-10-23 ENCOUNTER — Ambulatory Visit (INDEPENDENT_AMBULATORY_CARE_PROVIDER_SITE_OTHER): Payer: 59 | Admitting: Behavioral Health

## 2021-10-23 ENCOUNTER — Other Ambulatory Visit: Payer: Self-pay

## 2021-10-23 DIAGNOSIS — F330 Major depressive disorder, recurrent, mild: Secondary | ICD-10-CM

## 2021-10-23 MED ORDER — BUPROPION HCL ER (SR) 150 MG PO TB12: 150.0000 mg | ORAL_TABLET | Freq: Two times a day (BID) | ORAL | 1 refills | Status: AC

## 2021-10-23 NOTE — Progress Notes (Signed)
[facility]  Patient ID: Ruben Reeves,  MRN: [id number]  PCP: Burnard Bunting, MD  Date of Evaluation: 10/23/2021 Time spent:20 minutes  Chief Complaint:  Chief Complaint   Depression; Follow-up; Medication Refill     HISTORY/CURRENT STATUS: HPI  60 year old male presents to this office for follow up and medication management. He says that he is doing very well and is feeling very stable with depression at this time. He is coping with life's daily stressors better. Say his wife continues to notice improvement. He is having some issues with prostate and problems urinating. Says has improved since initiating Flomax every 4 days. He reports anxiety today 0 and depression 2/10. Says he is sleeping 7-8 hours per night. No mania, no psychosis. No SI/HI.    No previous medication trials.     Individual Medical History/ Review of Systems: Changes? :No   Allergies: Sulfa antibiotics  Current Medications:  Current Outpatient Medications:    aspirin 81 MG tablet, Take 81 mg by mouth daily., Disp: , Rfl:    buPROPion (WELLBUTRIN SR) 150 MG 12 hr tablet, Take 1 tablet (150 mg total) by mouth 2 (two) times daily., Disp: 180 tablet, Rfl: 1   buPROPion (WELLBUTRIN XL) 300 MG 24 hr tablet, Take 1 tablet (300 mg total) by mouth daily., Disp: 90 tablet, Rfl: 1   cholecalciferol (VITAMIN D) 1000 UNITS tablet, Take 1,000 Units by mouth 2 (two) times daily., Disp: , Rfl:    Coenzyme Q10 (CO Q 10 PO), Take 200 mg by mouth daily., Disp: , Rfl:    hydrochlorothiazide (HYDRODIURIL) 25 MG tablet, Take 25 mg by mouth daily., Disp: , Rfl:    ipratropium (ATROVENT) 0.06 % nasal spray, Place 2 sprays into both nostrils 4 (four) times daily. (Patient not taking: No sig reported), Disp: 15 mL, Rfl: 0   lisinopril (PRINIVIL,ZESTRIL) 20 MG tablet, Take 20 mg by mouth daily., Disp: , Rfl:    Magnesium Oxide (MAG-CAPS PO), Take by mouth. mag citrate 400mg  po daily (Patient not taking: No sig  reported), Disp: , Rfl:    Multiple Vitamin (MULTIVITAMIN) tablet, Take 1 tablet by mouth daily., Disp: , Rfl:    Omega-3 Fatty Acids (FISH OIL) 1200 MG CPDR, Take 1,200 mg by mouth 2 (two) times daily., Disp: , Rfl:    simvastatin (ZOCOR) 20 MG tablet, Take 20 mg by mouth daily., Disp: , Rfl:    TURMERIC PO, Take 1,160 mg by mouth daily. (Patient not taking: Reported on 04/23/2021), Disp: , Rfl:    vitamin C (ASCORBIC ACID) 500 MG tablet, Take 500 mg by mouth daily., Disp: , Rfl:  Medication Side Effects: none  Family Medical/ Social History: Changes? No  MENTAL HEALTH EXAM:  There were no vitals taken for this visit.There is no height or weight on file to calculate BMI.  General Appearance: Casual and Neat  Eye Contact:  Good  Speech:  Clear and Coherent  Volume:  Normal  Mood:  NA  Affect:  Appropriate  Thought Process:  Coherent  Orientation:  Full (Time, Place, and Person)  Thought Content: Logical   Suicidal Thoughts:  No  Homicidal Thoughts:  No  Memory:  WNL  Judgement:  Good  Insight:  Good  Psychomotor Activity:  Normal  Concentration:  Concentration: Good  Recall:  Good  Fund of Knowledge: Good  Language: Good  Assets:  Desire for Improvement  ADL's:  Intact  Cognition: WNL  Prognosis:  Good    DIAGNOSES:  ICD-10-CM   1. Mild episode of recurrent major depressive disorder (HCC)  F33.0 buPROPion (WELLBUTRIN SR) 150 MG 12 hr tablet      Receiving Psychotherapy: No    RECOMMENDATIONS:   Will continue Wellbutrin  300 mg SR twice  daily To report side effects or worsening symptoms Provided after hours emergency contact info To follow up in 3 months to reassess. Greater than 50% of 20 min.face to face time with patient was spent on counseling and coordination of care. Discussed continued practice of good coping skills. Discussed need for socialization. Patient has progressed very well and is stable at this time. Reviewed PDMP          Elwanda Brooklyn,  NP

## 2022-01-22 ENCOUNTER — Other Ambulatory Visit: Payer: Self-pay

## 2022-01-22 ENCOUNTER — Encounter: Payer: Self-pay | Admitting: Behavioral Health

## 2022-01-22 ENCOUNTER — Ambulatory Visit (INDEPENDENT_AMBULATORY_CARE_PROVIDER_SITE_OTHER): Payer: 59 | Admitting: Behavioral Health

## 2022-01-22 DIAGNOSIS — F330 Major depressive disorder, recurrent, mild: Secondary | ICD-10-CM | POA: Diagnosis not present

## 2022-01-22 NOTE — Progress Notes (Signed)
[facility] ? ?Patient ID: Ruben Reeves,  ?MRN: [id number] ? ?PCP: Burnard Bunting, MD ? ?Date of Evaluation: 01/22/2022 ?Time spent:20 minutes ? ?Chief Complaint:  ?Chief Complaint   ?Anxiety; Depression; Follow-up ?  ? ? ?HISTORY/CURRENT STATUS: ?HPI ? ? ?61 year old male presents to this office for follow up and medication management. He says that he is doing very well and is feeling very stable with depression at this time. He is currently doing a carnivore diet and says that he feels the best he every has. He says that his depression has greatly improved and he reduced in Wellbutrin to 150 mg SR daily. He reports anxiety today 0 and depression 3/10. Says he is sleeping 7-8 hours per night. No mania, no psychosis. No SI/HI. Requesting 4 month follow up.  ?  ?No previous medication trials ? ?Individual Medical History/ Review of Systems: Changes? :No  ? ?Allergies: Sulfa antibiotics ? ?Current Medications:  ?Current Outpatient Medications:  ?  aspirin 81 MG tablet, Take 81 mg by mouth daily., Disp: , Rfl:  ?  buPROPion (WELLBUTRIN SR) 150 MG 12 hr tablet, Take 1 tablet (150 mg total) by mouth 2 (two) times daily., Disp: 180 tablet, Rfl: 1 ?  cholecalciferol (VITAMIN D) 1000 UNITS tablet, Take 1,000 Units by mouth 2 (two) times daily., Disp: , Rfl:  ?  Coenzyme Q10 (CO Q 10 PO), Take 200 mg by mouth daily., Disp: , Rfl:  ?  hydrochlorothiazide (HYDRODIURIL) 25 MG tablet, Take 25 mg by mouth daily., Disp: , Rfl:  ?  ipratropium (ATROVENT) 0.06 % nasal spray, Place 2 sprays into both nostrils 4 (four) times daily. (Patient not taking: No sig reported), Disp: 15 mL, Rfl: 0 ?  lisinopril (PRINIVIL,ZESTRIL) 20 MG tablet, Take 20 mg by mouth daily., Disp: , Rfl:  ?  Magnesium Oxide (MAG-CAPS PO), Take by mouth. mag citrate '400mg'$  po daily (Patient not taking: No sig reported), Disp: , Rfl:  ?  Multiple Vitamin (MULTIVITAMIN) tablet, Take 1 tablet by mouth daily., Disp: , Rfl:  ?  Omega-3 Fatty Acids (FISH  OIL) 1200 MG CPDR, Take 1,200 mg by mouth 2 (two) times daily., Disp: , Rfl:  ?  simvastatin (ZOCOR) 20 MG tablet, Take 20 mg by mouth daily., Disp: , Rfl:  ?  TURMERIC PO, Take 1,160 mg by mouth daily. (Patient not taking: Reported on 04/23/2021), Disp: , Rfl:  ?  vitamin C (ASCORBIC ACID) 500 MG tablet, Take 500 mg by mouth daily., Disp: , Rfl:  ?Medication Side Effects: none ? ?Family Medical/ Social History: Changes? No ? ?MENTAL HEALTH EXAM: ? ?There were no vitals taken for this visit.There is no height or weight on file to calculate BMI.  ?General Appearance: Casual, Neat, and Well Groomed  ?Eye Contact:  Good  ?Speech:  Clear and Coherent  ?Volume:  Normal  ?Mood:  Anxious and Depressed  ?Affect:  Appropriate  ?Thought Process:  Coherent  ?Orientation:  Full (Time, Place, and Person)  ?Thought Content: Logical   ?Suicidal Thoughts:  No  ?Homicidal Thoughts:  No  ?Memory:  WNL  ?Judgement:  Good  ?Insight:  Good  ?Psychomotor Activity:  Normal  ?Concentration:  Concentration: Good  ?Recall:  Good  ?Fund of Knowledge: Good  ?Language: Good  ?Assets:  Desire for Improvement  ?ADL's:  Intact  ?Cognition: WNL  ?Prognosis:  Good  ? ? ?DIAGNOSES:  ?  ICD-10-CM   ?1. Mild episode of recurrent major depressive disorder ([place])  F33.0   ?  ? ? ?  Receiving Psychotherapy: No  ? ? ?RECOMMENDATIONS:  ? ? ?Will continue Wellbutrin 150 mg SR twice  daily ?To report side effects or worsening symptoms ?Provided after hours emergency contact info ?To follow up in 3 months to reassess. ?Greater than 50% of 20 min.face to face time with patient was spent on counseling and coordination of care. Discussed continued practice of good coping skills. Discussed need for socialization. Patient has progressed very well and is stable at this time. ?Reviewed PDMP   ? ? ? ?Elwanda Brooklyn, NP  ?

## 2022-05-22 ENCOUNTER — Ambulatory Visit: Payer: 59 | Admitting: Behavioral Health

## 2022-06-02 ENCOUNTER — Encounter: Payer: Self-pay | Admitting: Behavioral Health

## 2022-06-02 ENCOUNTER — Ambulatory Visit (INDEPENDENT_AMBULATORY_CARE_PROVIDER_SITE_OTHER): Payer: 59 | Admitting: Behavioral Health

## 2022-06-02 DIAGNOSIS — F330 Major depressive disorder, recurrent, mild: Secondary | ICD-10-CM | POA: Diagnosis not present

## 2022-06-02 NOTE — Progress Notes (Signed)
[facility]  Patient ID: Ruben Reeves,  MRN: [id number]  PCP: Burnard Bunting, MD  Date of Evaluation: 06/02/2022 Time spent:20 minutes  Chief Complaint:  Chief Complaint   Anxiety; Depression; Follow-up; Patient Education     HISTORY/CURRENT STATUS: HPI  61 year old male presents to this office for follow up and medication management. He says that he is doing very well and is feeling very stable with depression at this time. He is currently doing a carnivore diet and says that he feels the best he every has. He has currently stopped his Wellbutrin for now  because he feels like he does not need it. He reports anxiety today 0 and depression 2/10. Says he is sleeping 7-8 hours per night. No mania, no psychosis. No SI/HI. Requesting 4 month follow up.    No previous medication trials    Pt is own Carnivore diet for 6 months. Stopped taking Wellbutrin and would like to remain off for now. Says he will monitor. To report side effects or worsening symptoms Provided after hours emergency contact info To follow up in 6 months to reassess. Greater than 50% of 20 min.face to face time with patient was spent on counseling and coordination of care. Discussed continued practice of good coping skills. Discussed need for socialization. Patient has progressed very well and is stable at this time. Reviewed PDMP    Individual Medical History/ Review of Systems: Changes? :No   Allergies: Sulfa antibiotics  Current Medications:  Current Outpatient Medications:    aspirin 81 MG tablet, Take 81 mg by mouth daily., Disp: , Rfl:    buPROPion (WELLBUTRIN SR) 150 MG 12 hr tablet, Take 1 tablet (150 mg total) by mouth 2 (two) times daily., Disp: 180 tablet, Rfl: 1   cholecalciferol (VITAMIN D) 1000 UNITS tablet, Take 1,000 Units by mouth 2 (two) times daily., Disp: , Rfl:    Coenzyme Q10 (CO Q 10 PO), Take 200 mg by mouth daily., Disp: , Rfl:    hydrochlorothiazide (HYDRODIURIL) 25 MG  tablet, Take 25 mg by mouth daily., Disp: , Rfl:    ipratropium (ATROVENT) 0.06 % nasal spray, Place 2 sprays into both nostrils 4 (four) times daily. (Patient not taking: No sig reported), Disp: 15 mL, Rfl: 0   lisinopril (PRINIVIL,ZESTRIL) 20 MG tablet, Take 20 mg by mouth daily., Disp: , Rfl:    Magnesium Oxide (MAG-CAPS PO), Take by mouth. mag citrate '400mg'$  po daily (Patient not taking: No sig reported), Disp: , Rfl:    Multiple Vitamin (MULTIVITAMIN) tablet, Take 1 tablet by mouth daily., Disp: , Rfl:    Omega-3 Fatty Acids (FISH OIL) 1200 MG CPDR, Take 1,200 mg by mouth 2 (two) times daily., Disp: , Rfl:    simvastatin (ZOCOR) 20 MG tablet, Take 20 mg by mouth daily., Disp: , Rfl:    TURMERIC PO, Take 1,160 mg by mouth daily. (Patient not taking: Reported on 04/23/2021), Disp: , Rfl:    vitamin C (ASCORBIC ACID) 500 MG tablet, Take 500 mg by mouth daily., Disp: , Rfl:  Medication Side Effects: none  Family Medical/ Social History: Changes? No  MENTAL HEALTH EXAM:  There were no vitals taken for this visit.There is no height or weight on file to calculate BMI.  General Appearance: Casual and Neat  Eye Contact:  Good  Speech:  Clear and Coherent  Volume:  Normal  Mood:  NA  Affect:  Appropriate  Thought Process:  Coherent  Orientation:  Full (Time, Place, and  Person)  Thought Content: Logical   Suicidal Thoughts:  No  Homicidal Thoughts:  No  Memory:  WNL  Judgement:  Good  Insight:  Good  Psychomotor Activity:  Normal  Concentration:  Concentration: Good  Recall:  Good  Fund of Knowledge: Good  Language: Good  Assets:  Desire for Improvement  ADL's:  Intact  Cognition: WNL  Prognosis:  Good    DIAGNOSES: No diagnosis found.  Receiving Psychotherapy: No          Elwanda Brooklyn, NP

## 2022-07-17 ENCOUNTER — Other Ambulatory Visit: Payer: Self-pay | Admitting: Internal Medicine

## 2022-07-17 DIAGNOSIS — Z1331 Encounter for screening for depression: Secondary | ICD-10-CM

## 2022-10-20 ENCOUNTER — Other Ambulatory Visit: Payer: 59

## 2022-10-20 ENCOUNTER — Ambulatory Visit
Admission: RE | Admit: 2022-10-20 | Discharge: 2022-10-20 | Disposition: A | Discharge status: Home / Self Care | Payer: No Typology Code available for payment source | Source: Ambulatory Visit | Attending: Internal Medicine | Admitting: Internal Medicine

## 2022-10-20 DIAGNOSIS — Z1331 Encounter for screening for depression: Secondary | ICD-10-CM

## 2022-12-03 ENCOUNTER — Ambulatory Visit: Payer: 59 | Admitting: Behavioral Health

## 2024-05-26 ENCOUNTER — Encounter: Payer: Self-pay | Admitting: Internal Medicine

## 2024-06-07 ENCOUNTER — Telehealth: Payer: Self-pay

## 2024-06-07 NOTE — Telephone Encounter (Signed)
Mutiple attempts made to complete PV. Unable to reach patient. VM left. Pt to call the office back by 5 PM to have PV rescheduled. Pt made aware that in the event that we do not hear back from them their PV and scheduled procedure will be cancelled.  

## 2024-06-13 ENCOUNTER — Encounter: Payer: Self-pay | Admitting: Internal Medicine

## 2024-06-13 ENCOUNTER — Ambulatory Visit (AMBULATORY_SURGERY_CENTER): Payer: Self-pay

## 2024-06-13 VITALS — Ht 72.5 in | Wt 270.0 lb

## 2024-06-13 DIAGNOSIS — Z1211 Encounter for screening for malignant neoplasm of colon: Secondary | ICD-10-CM

## 2024-06-13 MED ORDER — NA SULFATE-K SULFATE-MG SULF 17.5-3.13-1.6 GM/177ML PO SOLN: 1.0000 | Freq: Once | ORAL | 0 refills | Status: AC

## 2024-06-13 NOTE — Progress Notes (Signed)
 No egg or soy allergy known to patient  No issues known to pt with past sedation with any surgeries or procedures Patient denies ever being told they had issues or difficulty with intubation  No FH of Malignant Hyperthermia Pt is not on diet pills Pt is not on  home 02 CPAP: yes  Pt is not on blood thinners  Pt denies issues with constipation  No A fib or A flutter Have any cardiac testing pending-- no  LOA: independent  Prep: suprep   PV completed with patient. Prep instructions sent via mychart and home address.

## 2024-06-21 ENCOUNTER — Ambulatory Visit (AMBULATORY_SURGERY_CENTER): Payer: Self-pay | Admitting: Internal Medicine

## 2024-06-21 ENCOUNTER — Encounter: Payer: Self-pay | Admitting: Internal Medicine

## 2024-06-21 VITALS — BP 114/67 | HR 63 | Temp 98.0°F | Resp 17 | Ht 72.0 in | Wt 270.0 lb

## 2024-06-21 DIAGNOSIS — D125 Benign neoplasm of sigmoid colon: Secondary | ICD-10-CM

## 2024-06-21 DIAGNOSIS — D122 Benign neoplasm of ascending colon: Secondary | ICD-10-CM

## 2024-06-21 DIAGNOSIS — Z1211 Encounter for screening for malignant neoplasm of colon: Secondary | ICD-10-CM | POA: Diagnosis present

## 2024-06-21 DIAGNOSIS — K635 Polyp of colon: Secondary | ICD-10-CM

## 2024-06-21 DIAGNOSIS — K5730 Diverticulosis of large intestine without perforation or abscess without bleeding: Secondary | ICD-10-CM | POA: Diagnosis not present

## 2024-06-21 DIAGNOSIS — Z860101 Personal history of adenomatous and serrated colon polyps: Secondary | ICD-10-CM | POA: Diagnosis not present

## 2024-06-21 MED ORDER — SODIUM CHLORIDE 0.9 % IV SOLN: 500.0000 mL | Freq: Once | INTRAVENOUS | Status: DC

## 2024-06-21 NOTE — Op Note (Signed)
 [facility] Patient Name: Ruben Reeves Procedure Date: 06/21/2024 2:02 PM MRN: [id number] Endoscopist: Gordy CHRISTELLA Starch , MD, [id number] Age: 63 Referring MD:  Date of Birth: [birth year] Gender: Male Account #: [id number] Procedure:                Colonoscopy Indications:              High risk colon cancer surveillance: Personal                            history of non-advanced adenoma and SSP, Last                            colonoscopy: September 2015 (TA x 1 and SSP x 1) Medicines:                Monitored Anesthesia Care Procedure:                Pre-Anesthesia Assessment:                           - Prior to the procedure, a History and Physical                            was performed, and patient medications and                            allergies were reviewed. The patient's tolerance of                            previous anesthesia was also reviewed. The risks                            and benefits of the procedure and the sedation                            options and risks were discussed with the patient.                            All questions were answered, and informed consent                            was obtained. Prior Anticoagulants: The patient has                            taken no anticoagulant or antiplatelet agents. ASA                            Grade Assessment: II - A patient with mild systemic                            disease. After reviewing the risks and benefits,                            the patient was deemed in satisfactory condition to  undergo the procedure.                           After obtaining informed consent, the colonoscope                            was passed under direct vision. Throughout the                            procedure, the patient's blood pressure, pulse, and                            oxygen saturations were monitored continuously. The                            CF [id number] #[id number]  was introduced through the anus                            and advanced to the cecum, identified by                            appendiceal orifice and ileocecal valve. The                            colonoscopy was performed without difficulty. The                            patient tolerated the procedure well. The quality                            of the bowel preparation was good. The ileocecal                            valve, appendiceal orifice, and rectum were                            photographed. Scope In: 2:16:28 PM Scope Out: 2:31:40 PM Scope Withdrawal Time: 0 hours 12 minutes 13 seconds  Total Procedure Duration: 0 hours 15 minutes 12 seconds  Findings:                 The digital rectal exam was normal.                           A 3 mm polyp was found in the ascending colon. The                            polyp was sessile. The polyp was removed with a                            cold snare. Resection and retrieval were complete.                           A 3 mm polyp was found in the sigmoid colon. The  polyp was sessile. The polyp was removed with a                            cold snare. Resection and retrieval were complete.                           Multiple medium-mouthed and small-mouthed                            diverticula were found in the sigmoid colon,                            descending colon, transverse colon and hepatic                            flexure.                           The retroflexed view of the distal rectum and anal                            verge was normal and showed no anal or rectal                            abnormalities. Complications:            No immediate complications. Estimated Blood Loss:     Estimated blood loss: none. Impression:               - One 3 mm polyp in the ascending colon, removed                            with a cold snare. Resected and retrieved.                           - One 3 mm  polyp in the sigmoid colon, removed with                            a cold snare. Resected and retrieved.                           - Severe diverticulosis in the sigmoid colon, in                            the descending colon, in the transverse colon and                            at the hepatic flexure.                           - The distal rectum and anal verge are normal on                            retroflexion view. Recommendation:           -  Patient has a contact number available for                            emergencies. The signs and symptoms of potential                            delayed complications were discussed with the                            patient. Return to normal activities tomorrow.                            Written discharge instructions were provided to the                            patient.                           - Resume previous diet.                           - Continue present medications.                           - Await pathology results.                           - Repeat colonoscopy is recommended for                            surveillance. The colonoscopy date will be                            determined after pathology results from today's                            exam become available for review. Gordy CHRISTELLA Starch, MD 06/21/2024 2:34:17 PM This report has been signed electronically.

## 2024-06-21 NOTE — Patient Instructions (Signed)
 Resume previous diet. Continue present medications. Awaiting pathology results.  Repeat colonoscopy is recommended for surveillance.  The colonoscopy date will be determined after pathology results from today's exam.  Handouts provided on polyps and diverticulosis.   YOU HAD AN ENDOSCOPIC PROCEDURE TODAY AT THE [facility]:   Refer to the procedure report that was given to you for any specific questions about what was found during the examination.  If the procedure report does not answer your questions, please call your gastroenterologist to clarify.  If you requested that your care partner not be given the details of your procedure findings, then the procedure report has been included in a sealed envelope for you to review at your convenience later.  YOU SHOULD EXPECT: Some feelings of bloating in the abdomen. Passage of more gas than usual.  Walking can help get rid of the air that was put into your GI tract during the procedure and reduce the bloating. If you had a lower endoscopy (such as a colonoscopy or flexible sigmoidoscopy) you may notice spotting of blood in your stool or on the toilet paper. If you underwent a bowel prep for your procedure, you may not have a normal bowel movement for a few days.  Please Note:  You might notice some irritation and congestion in your nose or some drainage.  This is from the oxygen used during your procedure.  There is no need for concern and it should clear up in a day or so.  SYMPTOMS TO REPORT IMMEDIATELY:  Following lower endoscopy (colonoscopy or flexible sigmoidoscopy):  Excessive amounts of blood in the stool  Significant tenderness or worsening of abdominal pains  Swelling of the abdomen that is new, acute  Fever of 100F or higher  For urgent or emergent issues, a gastroenterologist can be reached at any hour by calling [phone]. Do not use MyChart messaging for urgent concerns.    DIET:  We do recommend a small meal at  first, but then you may proceed to your regular diet.  Drink plenty of fluids but you should avoid alcoholic beverages for 24 hours.  ACTIVITY:  You should plan to take it easy for the rest of today and you should NOT DRIVE or use heavy machinery until tomorrow (because of the sedation medicines used during the test).    FOLLOW UP: Our staff will call the number listed on your records the next business day following your procedure.  We will call around 7:15- 8:00 am to check on you and address any questions or concerns that you may have regarding the information given to you following your procedure. If we do not reach you, we will leave a message.     If any biopsies were taken you will be contacted by phone or by letter within the next 1-3 weeks.  Please call us  at [phone] if you have not heard about the biopsies in 3 weeks.    SIGNATURES/CONFIDENTIALITY: You and/or your care partner have signed paperwork which will be entered into your electronic medical record.  These signatures attest to the fact that that the information above on your After Visit Summary has been reviewed and is understood.  Full responsibility of the confidentiality of this discharge information lies with you and/or your care-partner.

## 2024-06-21 NOTE — Progress Notes (Signed)
 GASTROENTEROLOGY PROCEDURE H&P NOTE   Primary Care Physician: Shepard Ade, MD    Reason for Procedure:  History of colon polyps  Plan:    Colonoscopy  Patient is appropriate for endoscopic procedure(s) in the ambulatory ([facility]) setting.  The nature of the procedure, as well as the risks, benefits, and alternatives were carefully and thoroughly reviewed with the patient. Ample time for discussion and questions allowed. The patient understood, was satisfied, and agreed to proceed.     HPI: Ruben Reeves is a 63 y.o. male who presents for surveillance colonoscopy.  Medical history as below.  Tolerated the prep.  No recent chest pain or shortness of breath.  No abdominal pain today.  Past Medical History:  Diagnosis Date   Hypercholesteremia    Hypertension     Past Surgical History:  Procedure Laterality Date   INGUINAL HERNIA REPAIR     WISDOM TOOTH EXTRACTION      Prior to Admission medications   Medication Sig Start Date End Date Taking? Authorizing Provider  aspirin 81 MG tablet Take 81 mg by mouth daily.   Yes [provider]  cholecalciferol (VITAMIN D) 1000 UNITS tablet Take 1,000 Units by mouth 2 (two) times daily. Patient taking differently: Take 2,000 Units by mouth daily.   Yes [provider]  hydrochlorothiazide (HYDRODIURIL) 25 MG tablet Take 25 mg by mouth daily.   Yes [provider]  Anselm Frizzle 1000 MG CAPS as directed Orally   Yes [provider]  lisinopril (PRINIVIL,ZESTRIL) 20 MG tablet Take 20 mg by mouth daily.   Yes [provider]  tamsulosin (FLOMAX) 0.4 MG CAPS capsule Take 0.4 mg by mouth daily.   Yes [provider]  TURMERIC PO Take 1,160 mg by mouth daily.   Yes [provider]  vitamin C (ASCORBIC ACID) 500 MG tablet Take 500 mg by mouth daily.   Yes [provider]  buPROPion  (WELLBUTRIN  SR) 150 MG 12 hr tablet Take 1 tablet (150 mg total) by mouth 2 (two) times  daily. Patient not taking: No sig reported 10/23/21   Teresa Rogue A, NP  Coenzyme Q10 (CO Q 10 PO) Take 200 mg by mouth daily. Patient not taking: No sig reported    [provider]  ipratropium (ATROVENT ) 0.06 % nasal spray Place 2 sprays into both nostrils 4 (four) times daily. Patient not taking: Reported on 03/26/2021 10/20/16   Anitra Rocky KIDD, PA-C  Magnesium Oxide (MAG-CAPS PO) Take by mouth. mag citrate 400mg  po daily Patient not taking: Reported on 03/26/2021    [provider]  Multiple Vitamin (MULTIVITAMIN) tablet Take 1 tablet by mouth daily. Patient not taking: No sig reported    [provider]  simvastatin (ZOCOR) 20 MG tablet Take 20 mg by mouth daily. Patient not taking: No sig reported    [provider]    Current Outpatient Medications  Medication Sig Dispense Refill   aspirin 81 MG tablet Take 81 mg by mouth daily.     cholecalciferol (VITAMIN D) 1000 UNITS tablet Take 1,000 Units by mouth 2 (two) times daily. (Patient taking differently: Take 2,000 Units by mouth daily.)     hydrochlorothiazide (HYDRODIURIL) 25 MG tablet Take 25 mg by mouth daily.     Krill Oil 1000 MG CAPS as directed Orally     lisinopril (PRINIVIL,ZESTRIL) 20 MG tablet Take 20 mg by mouth daily.     tamsulosin (FLOMAX) 0.4 MG CAPS capsule Take 0.4 mg by mouth  daily.     TURMERIC PO Take 1,160 mg by mouth daily.     vitamin C (ASCORBIC ACID) 500 MG tablet Take 500 mg by mouth daily.     buPROPion  (WELLBUTRIN  SR) 150 MG 12 hr tablet Take 1 tablet (150 mg total) by mouth 2 (two) times daily. (Patient not taking: No sig reported) 180 tablet 1   Coenzyme Q10 (CO Q 10 PO) Take 200 mg by mouth daily. (Patient not taking: No sig reported)     ipratropium (ATROVENT ) 0.06 % nasal spray Place 2 sprays into both nostrils 4 (four) times daily. (Patient not taking: Reported on 03/26/2021) 15 mL 0   Magnesium Oxide (MAG-CAPS PO) Take by mouth. mag citrate 400mg  po daily (Patient  not taking: Reported on 03/26/2021)     Multiple Vitamin (MULTIVITAMIN) tablet Take 1 tablet by mouth daily. (Patient not taking: No sig reported)     simvastatin (ZOCOR) 20 MG tablet Take 20 mg by mouth daily. (Patient not taking: No sig reported)     Current Facility-Administered Medications  Medication Dose Route Frequency Provider Last Rate Last Admin   0.9 %  sodium chloride  infusion  500 mL Intravenous Once Kalan Yeley, Gordy HERO, MD        Allergies as of 06/21/2024 - Review Complete 06/21/2024  Allergen Reaction Noted   Ciprofloxacin Dermatitis 03/17/2011   Sulfa antibiotics Rash 06/29/2014   Sulfamethoxazole Dermatitis 02/18/2012    Family History  Problem Relation Age of Onset   Colon polyps Mother    Anxiety disorder Father    Bladder Cancer Father    Kidney disease Father    Colon polyps Father    Depression Father    Stroke Maternal Grandfather    Heart disease Maternal Grandmother    Stroke Paternal Grandfather    Heart disease Paternal Grandmother    Colon cancer Neg Hx    Pancreatic cancer Neg Hx    Rectal cancer Neg Hx    Stomach cancer Neg Hx     Social History   Socioeconomic History   Marital status: Married    Spouse name: Reena   Number of children: 1   Years of education: Not on file   Highest education level: Associate degree: academic program  Occupational History   Occupation: Conservator, museum/gallery Relationship    Comment: [employer]  Tobacco Use   Smoking status: Former    Current packs/day: 0.00    Types: Cigarettes    Quit date: 06/29/1985    Years since quitting: 39.0   Smokeless tobacco: Never  Substance and Sexual Activity   Alcohol use: Yes    Alcohol/week: 1.0 standard drink of alcohol    Types: 1 Glasses of wine per week   Drug use: No   Sexual activity: Not Currently    Comment: no intimacy in 8 years  Other Topics Concern   Not on file  Social History Narrative   Lives with wife in [place]. Likes to live solitude and  does not like to socialize outside of work. Not been on vacation since 2012. Not gone outside home except this visit since 2020 due to covid and wife not having vaccine.    Social Drivers of Corporate investment banker Strain: Not on file  Food Insecurity: Not on file  Transportation Needs: Not on file  Physical Activity: Not on file  Stress: Not on file  Social Connections: Not on file  Intimate Partner Violence: Not on file  Physical Exam: Vital signs in last 24 hours: @BP  132/67   Pulse 65   Temp 98 F (36.7 C)   Ht 6' (1.829 m)   Wt 270 lb (122.5 kg)   SpO2 99%   BMI 36.62 kg/m  GEN: NAD EYE: Sclerae anicteric ENT: MMM CV: Non-tachycardic Pulm: CTA b/l GI: Soft, NT/ND NEURO:  Alert & Oriented x 3   Gordy Starch, MD [facility]  06/21/2024 2:08 PM

## 2024-06-21 NOTE — Progress Notes (Signed)
 Called to room to assist during endoscopic procedure.  Patient ID and intended procedure confirmed with present staff. Received instructions for my participation in the procedure from the performing physician.

## 2024-06-21 NOTE — Progress Notes (Signed)
 Pt's states no medical or surgical changes since previsit or office visit.

## 2024-06-21 NOTE — Progress Notes (Signed)
 PT taken to PACU. Monitors in place. VSS. Report given to RN.

## 2024-06-22 ENCOUNTER — Telehealth: Payer: Self-pay | Admitting: *Deleted

## 2024-06-22 NOTE — Telephone Encounter (Signed)
  Follow up Call-     06/21/2024    1:24 PM  Call back number  Post procedure Call Back phone  # [phone]  Permission to leave phone message Yes     Patient questions:  Do you have a fever, pain , or abdominal swelling? No. Pain Score  0 *  Have you tolerated food without any problems? Yes.    Have you been able to return to your normal activities? Yes.    Do you have any questions about your discharge instructions: Diet   No. Medications  No. Follow up visit  No.  Do you have questions or concerns about your Care? No.  Actions: * If pain score is 4 or above: No action needed, pain <4.

## 2024-06-24 LAB — SURGICAL PATHOLOGY

## 2024-06-28 ENCOUNTER — Ambulatory Visit: Payer: Self-pay | Admitting: Internal Medicine
